# Patient Record
Sex: Male | Born: 1955 | Hispanic: No | Marital: Married | Smoking: Never smoker
Health system: Southern US, Community
[De-identification: ages and names within clinical notes are randomized; demographics above are authoritative.]

## PROBLEM LIST (undated history)

## (undated) DIAGNOSIS — E119 Type 2 diabetes mellitus without complications: Secondary | ICD-10-CM

## (undated) DIAGNOSIS — I10 Essential (primary) hypertension: Secondary | ICD-10-CM

## (undated) DIAGNOSIS — E7800 Pure hypercholesterolemia, unspecified: Secondary | ICD-10-CM

## (undated) HISTORY — PX: OTHER SURGICAL HISTORY: SHX169

---

## 2009-08-17 ENCOUNTER — Emergency Department (HOSPITAL_COMMUNITY): Admission: EM | Admit: 2009-08-17 | Discharge: 2009-08-17 | Payer: Self-pay | Admitting: Emergency Medicine

## 2010-03-11 ENCOUNTER — Ambulatory Visit (HOSPITAL_BASED_OUTPATIENT_CLINIC_OR_DEPARTMENT_OTHER): Admission: RE | Admit: 2010-03-11 | Discharge: 2010-03-11 | Payer: Self-pay | Admitting: General Surgery

## 2010-08-26 LAB — GLUCOSE, CAPILLARY
Glucose-Capillary: 136 mg/dL — ABNORMAL HIGH (ref 70–99)
Glucose-Capillary: 163 mg/dL — ABNORMAL HIGH (ref 70–99)

## 2010-08-26 LAB — BASIC METABOLIC PANEL
Calcium: 8.9 mg/dL (ref 8.4–10.5)
Chloride: 104 mEq/L (ref 96–112)
GFR calc Af Amer: >60 mL/min (ref >60)
GFR calc non Af Amer: >60 mL/min (ref >60)

## 2010-08-26 LAB — POCT HEMOGLOBIN-HEMACUE: Hemoglobin: 14.3 g/dL (ref 13.0–17.0)

## 2015-09-03 ENCOUNTER — Encounter (HOSPITAL_COMMUNITY): Payer: Self-pay

## 2015-09-03 ENCOUNTER — Emergency Department (HOSPITAL_COMMUNITY): Payer: Medicaid Other

## 2015-09-03 ENCOUNTER — Observation Stay (HOSPITAL_COMMUNITY)
Admission: EM | Admit: 2015-09-03 | Discharge: 2015-09-04 | Disposition: A | Discharge status: Home / Self Care | Payer: Medicaid Other | Attending: Internal Medicine | Admitting: Internal Medicine

## 2015-09-03 DIAGNOSIS — I10 Essential (primary) hypertension: Secondary | ICD-10-CM | POA: Diagnosis present

## 2015-09-03 DIAGNOSIS — N183 Chronic kidney disease, stage 3 unspecified: Secondary | ICD-10-CM | POA: Insufficient documentation

## 2015-09-03 DIAGNOSIS — R29818 Other symptoms and signs involving the nervous system: Secondary | ICD-10-CM | POA: Diagnosis present

## 2015-09-03 DIAGNOSIS — R200 Anesthesia of skin: Secondary | ICD-10-CM | POA: Diagnosis not present

## 2015-09-03 DIAGNOSIS — I5032 Chronic diastolic (congestive) heart failure: Secondary | ICD-10-CM | POA: Diagnosis not present

## 2015-09-03 DIAGNOSIS — I6523 Occlusion and stenosis of bilateral carotid arteries: Secondary | ICD-10-CM | POA: Insufficient documentation

## 2015-09-03 DIAGNOSIS — N184 Chronic kidney disease, stage 4 (severe): Secondary | ICD-10-CM | POA: Insufficient documentation

## 2015-09-03 DIAGNOSIS — R2990 Unspecified symptoms and signs involving the nervous system: Secondary | ICD-10-CM | POA: Diagnosis not present

## 2015-09-03 DIAGNOSIS — E7800 Pure hypercholesterolemia, unspecified: Secondary | ICD-10-CM | POA: Diagnosis present

## 2015-09-03 DIAGNOSIS — R202 Paresthesia of skin: Secondary | ICD-10-CM | POA: Diagnosis not present

## 2015-09-03 DIAGNOSIS — E1121 Type 2 diabetes mellitus with diabetic nephropathy: Secondary | ICD-10-CM | POA: Insufficient documentation

## 2015-09-03 DIAGNOSIS — G459 Transient cerebral ischemic attack, unspecified: Secondary | ICD-10-CM | POA: Diagnosis present

## 2015-09-03 DIAGNOSIS — I130 Hypertensive heart and chronic kidney disease with heart failure and stage 1 through stage 4 chronic kidney disease, or unspecified chronic kidney disease: Secondary | ICD-10-CM | POA: Insufficient documentation

## 2015-09-03 DIAGNOSIS — E119 Type 2 diabetes mellitus without complications: Secondary | ICD-10-CM | POA: Insufficient documentation

## 2015-09-03 DIAGNOSIS — Z7982 Long term (current) use of aspirin: Secondary | ICD-10-CM | POA: Diagnosis not present

## 2015-09-03 DIAGNOSIS — E1122 Type 2 diabetes mellitus with diabetic chronic kidney disease: Secondary | ICD-10-CM | POA: Insufficient documentation

## 2015-09-03 DIAGNOSIS — N182 Chronic kidney disease, stage 2 (mild): Secondary | ICD-10-CM | POA: Insufficient documentation

## 2015-09-03 HISTORY — DX: Essential (primary) hypertension: I10

## 2015-09-03 HISTORY — DX: Pure hypercholesterolemia, unspecified: E78.00

## 2015-09-03 HISTORY — DX: Type 2 diabetes mellitus without complications: E11.9

## 2015-09-03 LAB — COMPREHENSIVE METABOLIC PANEL
ALBUMIN: 3.8 g/dL (ref 3.5–5.0)
ALK PHOS: 88 U/L (ref 38–126)
ALT: 28 U/L (ref 17–63)
ANION GAP: 11 (ref 5–15)
AST: 22 U/L (ref 15–41)
BILIRUBIN TOTAL: 0.5 mg/dL (ref 0.3–1.2)
BUN: 29 mg/dL — ABNORMAL HIGH (ref 6–20)
CALCIUM: 9.2 mg/dL (ref 8.9–10.3)
CO2: 21 mmol/L — ABNORMAL LOW (ref 22–32)
Chloride: 107 mmol/L (ref 101–111)
Creatinine, Ser: 1.67 mg/dL — ABNORMAL HIGH (ref 0.61–1.24)
GFR calc non Af Amer: 43 mL/min — ABNORMAL LOW (ref >60)
GFR, EST AFRICAN AMERICAN: 50 mL/min — AB (ref >60)
GLUCOSE: 113 mg/dL — AB (ref 65–99)
Potassium: 4.4 mmol/L (ref 3.5–5.1)
Sodium: 139 mmol/L (ref 135–145)
TOTAL PROTEIN: 6.8 g/dL (ref 6.5–8.1)

## 2015-09-03 LAB — CBC
HEMATOCRIT: 43.7 % (ref 39.0–52.0)
HEMOGLOBIN: 14.5 g/dL (ref 13.0–17.0)
MCH: 25.7 pg — AB (ref 26.0–34.0)
MCHC: 33.2 g/dL (ref 30.0–36.0)
MCV: 77.5 fL — AB (ref 78.0–100.0)
Platelets: 201 10*3/uL (ref 150–400)
RBC: 5.64 MIL/uL (ref 4.22–5.81)
RDW: 15.4 % (ref 11.5–15.5)
WBC: 7.6 10*3/uL (ref 4.0–10.5)

## 2015-09-03 LAB — GLUCOSE, CAPILLARY: GLUCOSE-CAPILLARY: 84 mg/dL (ref 65–99)

## 2015-09-03 LAB — PROTIME-INR
INR: 1.14 (ref 0.00–1.49)
Prothrombin Time: 14.4 seconds (ref 11.6–15.2)

## 2015-09-03 LAB — I-STAT TROPONIN, ED: Troponin i, poc: 0 ng/mL (ref 0.00–0.08)

## 2015-09-03 LAB — I-STAT CHEM 8, ED
BUN: 31 mg/dL — ABNORMAL HIGH (ref 6–20)
CALCIUM ION: 1.19 mmol/L (ref 1.13–1.30)
CREATININE: 1.6 mg/dL — AB (ref 0.61–1.24)
Chloride: 106 mmol/L (ref 101–111)
GLUCOSE: 109 mg/dL — AB (ref 65–99)
HCT: 47 % (ref 39.0–52.0)
HEMOGLOBIN: 16 g/dL (ref 13.0–17.0)
Potassium: 4.4 mmol/L (ref 3.5–5.1)
Sodium: 141 mmol/L (ref 135–145)
TCO2: 21 mmol/L (ref 0–100)

## 2015-09-03 LAB — ETHANOL: Alcohol, Ethyl (B): <5 mg/dL (ref <5)

## 2015-09-03 LAB — APTT: aPTT: 30 seconds (ref 24–37)

## 2015-09-03 MED ORDER — OMEGA-3-ACID ETHYL ESTERS 1 G PO CAPS
1.0000 g | ORAL_CAPSULE | Freq: Two times a day (BID) | ORAL | Status: DC
  Administered 2015-09-04: 1 g via ORAL
  Filled 2015-09-03 (×3): qty 1

## 2015-09-03 MED ORDER — IRBESARTAN 75 MG PO TABS
75.0000 mg | ORAL_TABLET | Freq: Every day | ORAL | Status: DC
  Administered 2015-09-04: 75 mg via ORAL
  Filled 2015-09-03: qty 1

## 2015-09-03 MED ORDER — ATENOLOL 50 MG PO TABS
50.0000 mg | ORAL_TABLET | Freq: Every day | ORAL | Status: DC
  Administered 2015-09-04: 50 mg via ORAL
  Filled 2015-09-03: qty 1

## 2015-09-03 MED ORDER — ASPIRIN 81 MG PO CHEW
81.0000 mg | CHEWABLE_TABLET | Freq: Once | ORAL | Status: AC
  Administered 2015-09-03: 81 mg via ORAL
  Filled 2015-09-03: qty 1

## 2015-09-03 MED ORDER — GLIPIZIDE 10 MG PO TABS
20.0000 mg | ORAL_TABLET | Freq: Every day | ORAL | Status: DC
  Administered 2015-09-04: 20 mg via ORAL
  Filled 2015-09-03: qty 2

## 2015-09-03 MED ORDER — ASPIRIN 300 MG RE SUPP
300.0000 mg | Freq: Every day | RECTAL | Status: DC
  Administered 2015-09-03: 300 mg via RECTAL
  Filled 2015-09-03 (×2): qty 1

## 2015-09-03 MED ORDER — SENNOSIDES-DOCUSATE SODIUM 8.6-50 MG PO TABS: 1.0000 | ORAL_TABLET | Freq: Every evening | ORAL | Status: DC | PRN

## 2015-09-03 MED ORDER — ASPIRIN 325 MG PO TABS
325.0000 mg | ORAL_TABLET | Freq: Every day | ORAL | Status: DC
  Administered 2015-09-04: 325 mg via ORAL
  Filled 2015-09-03 (×2): qty 1

## 2015-09-03 MED ORDER — ZOLPIDEM TARTRATE 5 MG PO TABS: 5.0000 mg | ORAL_TABLET | Freq: Every evening | ORAL | Status: DC | PRN

## 2015-09-03 MED ORDER — PANTOPRAZOLE SODIUM 40 MG PO TBEC
40.0000 mg | DELAYED_RELEASE_TABLET | Freq: Every day | ORAL | Status: DC
  Administered 2015-09-04: 40 mg via ORAL
  Filled 2015-09-03: qty 1

## 2015-09-03 MED ORDER — METFORMIN HCL 500 MG PO TABS: 1000.0000 mg | ORAL_TABLET | Freq: Every day | ORAL | Status: DC

## 2015-09-03 MED ORDER — STROKE: EARLY STAGES OF RECOVERY BOOK
Freq: Once | Status: DC
  Filled 2015-09-03: qty 1

## 2015-09-03 MED ORDER — INSULIN DETEMIR 100 UNIT/ML FLEXPEN: 10.0000 [IU] | PEN_INJECTOR | Freq: Every day | SUBCUTANEOUS | Status: DC

## 2015-09-03 NOTE — ED Notes (Signed)
Per pt, around 7 am this morning.  Noted numbness to rt hand and rt face. No change in speech.  Notified MD and went to Dr. Jonelle Sidle.  MD felt pt had stroke and sent him here.  Numbness is still there.  No neuro deficit noted.  Will notify MD.

## 2015-09-03 NOTE — Progress Notes (Signed)
PHARMACIST - PHYSICIAN COMMUNICATION DR: Rama  CONCERNING:  METFORMIN SAFE ADMINISTRATION POLICY  RECOMMENDATION: Metformin has been placed on DISCONTINUE (rejected order) STATUS and should be reordered only after any of the conditions below are ruled out.  Current Safety recommendations include avoiding metformin for a minimum of 48 hours after the patient's exposure to intravenous contrast media for the following conditions:  . eGFR < 60 ml/min  . Liver disease, alcoholism, heart failure, intra-arterial administration of contrast  DESCRIPTION:  The Pharmacy Committee has adopted a policy that restricts the use of metformin in hospitalized patients until all the contraindications to administration have been ruled out. Specific contraindications are: _0  Serum creatinine ? 1.5 for males _1  Serum creatinine ? 1.4 for females _2  Shock, acute MI, sepsis, hypoxemia, dehydration _3  Planned administration of intravenous iodinated contrast media when eGFR < 7m/min, Liver Disease, alcoholism, heart failure or intra-arterial administration of contrast _4  Heart Failure patients with low EF _5  Acute or chronic metabolic acidosis (including DKA)  TRomeo Rabon PharmD, pager [phone] 09/03/2015,6:07 PM.

## 2015-09-03 NOTE — ED Notes (Signed)
PT CAN GO UP AT 16:31

## 2015-09-03 NOTE — ED Notes (Signed)
Bed: [id number] Expected date:  Expected time:  Means of arrival:  Comments: Waiting to go up

## 2015-09-03 NOTE — H&P (Signed)
History and PhysicalRangel Fowler   [id number] DOB: [birth year] DOA: 09/03/2015  Referring MD/provider: Dr. Thomasene Lot PCP: Barbette Merino, MD   Chief Complaint: Transient facial numbness.  History of Present Illness:   Gabriel Fowler is an 60 y.o. male with a PMH of hypertension, diabetes and hyperlipidemia who comes to the hospital with reports of transient facial numbness in the perioral area and right arm which lasted approximately 1 minute. He had a similar episode yesterday, and an episode approximately 1 month ago. The patient denies any associated headache, speech difficulty, focal neurologic deficits or clumsiness, diplopia, or other symptoms suggestive of stroke. He states that he takes an aspirin daily. Patient reports that he recently started on an exercise regimen and wonders if this has anything to do with his symptoms. There were no other potential aggravating factors and the symptoms were transient. Upon initial evaluation in the ED, a CT scan of his head showed minimal periventricular small vessel disease but no acute infarct.  ROS:   Review of Systems  Constitutional: Negative.   HENT: Negative.   Eyes: Negative for blurred vision and double vision.  Respiratory: Negative for shortness of breath.   Cardiovascular: Negative for chest pain.  Gastrointestinal: Negative.   Genitourinary: Negative.   Musculoskeletal: Negative.   Skin: Negative.   Neurological: Positive for tingling and sensory change. Negative for dizziness, tremors, speech change, focal weakness, seizures, loss of consciousness and headaches.  Endo/Heme/Allergies: Negative.   Psychiatric/Behavioral: Negative.      Past Medical History:   Past Medical History  Diagnosis Date  . Diabetes mellitus without complication ([place])   . Hypertension   . Hypercholesteremia     Past Surgical History:   Past Surgical History  Procedure Laterality Date  . Extra skin removed from back of neck       Social History:   Social History   Social History  . Marital Status: Married    Spouse Name: N/A  . Number of Children: 3  . Years of Education: N/A   Occupational History  . Disabled    Social History Main Topics  . Smoking status: Never Smoker   . Smokeless tobacco: Never Used  . Alcohol Use: No  . Drug Use: No  . Sexual Activity: Yes   Other Topics Concern  . Not on file   Social History Narrative   Married.  Lives with wife.    Family history:   Family History  Problem Relation Age of Onset  . Stroke Neg Hx   . Heart disease Neg Hx   . Cancer Neg Hx     Allergies   Review of patient's allergies indicates no known allergies.  Current Medications:   Prior to Admission medications   Medication Sig Start Date End Date Taking? Authorizing Provider  aspirin 81 MG tablet Take 81 mg by mouth daily.   Yes Historical Provider, MD  atenolol (TENORMIN) 50 MG tablet Take 1 tablet by mouth daily. 08/25/15  Yes Historical Provider, MD  glipiZIDE (GLUCOTROL) 10 MG tablet Take 2 tablets by mouth daily.  08/25/15  Yes Historical Provider, MD  metFORMIN (GLUCOPHAGE) 1000 MG tablet Take 1 tablet by mouth daily.  08/25/15  Yes Historical Provider, MD  NEXIUM 40 MG capsule Take 1 capsule by mouth daily. 08/25/15  Yes Historical Provider, MD  Omega 3-6-9 Fatty Acids (OMEGA 3-6-9 COMPLEX PO) Take 1 tablet by mouth daily.   Yes Historical Provider, MD  valsartan (DIOVAN) 80 MG tablet  Take 1 tablet by mouth daily. 08/31/15  Yes Historical Provider, MD  zolpidem (AMBIEN) 5 MG tablet Take 1 tablet by mouth at bedtime as needed for sleep. Reported on 09/03/2015 06/16/15  Yes Historical Provider, MD  LEVEMIR FLEXTOUCH 100 UNIT/ML Pen Inject 10 Units into the skin at bedtime. 08/25/15   Historical Provider, MD    Physical Exam:   Filed Vitals:   09/03/15 1330 09/03/15 1342 09/03/15 1345 09/03/15 1422  BP: 146/88  158/89 146/94  Pulse: 56  57 58  Temp:  97.9 F (36.6 C)    TempSrc:       Resp:    24  SpO2: 99%  97% 99%     Physical Exam: Blood pressure 146/94, pulse 58, temperature 97.9 F (36.6 C), temperature source Oral, resp. rate 24, SpO2 99 %. Gen: No acute distress. Head: Normocephalic, atraumatic. Eyes: PERRL, EOMI, sclerae nonicteric. Mouth: Oropharynx clear, tongue midline, moist mucous membranes. Neck: Supple, no thyromegaly, no lymphadenopathy, no jugular venous distention. Chest: Lungs are clear to auscultation bilaterally. CV: Heart sounds are regular with no murmurs, rubs, or gallops. Abdomen: Soft, nontender, nondistended with normal active bowel sounds. Extremities: Extremities are without clubbing, edema, or cyanosis. Skin: Warm and dry. Neuro: Alert and oriented times 3; cranial nerves II-12 grossly intact, moves all extremities 4 with equal strength. Psych: Mood and affect normal.   Data Review:    Labs: Basic Metabolic Panel:  Recent Labs Lab 09/03/15 1334 09/03/15 1343  NA 139 141  K 4.4 4.4  CL 107 106  CO2 21*  --   GLUCOSE 113* 109*  BUN 29* 31*  CREATININE 1.67* 1.60*  CALCIUM 9.2  --    Liver Function Tests:  Recent Labs Lab 09/03/15 1334  AST 22  ALT 28  ALKPHOS 88  BILITOT 0.5  PROT 6.8  ALBUMIN 3.8   CBC:  Recent Labs Lab 09/03/15 1334 09/03/15 1343  WBC 7.6  --   HGB 14.5 16.0  HCT 43.7 47.0  MCV 77.5*  --   PLT 201  --     Radiographic Studies: Ct Head Wo Contrast  09/03/2015  CLINICAL DATA:  Right-sided facial numbness for 1 day EXAM: CT HEAD WITHOUT CONTRAST TECHNIQUE: Contiguous axial images were obtained from the base of the skull through the vertex without intravenous contrast. COMPARISON:  None. FINDINGS: The ventricles are normal in size and configuration. There is no intracranial mass, hemorrhage, extra-axial fluid collection, or midline shift. Is minimal small vessel disease in the centra semiovale bilaterally. Elsewhere, gray-white compartments are normal. No acute infarct evident. The  bony calvarium appears intact. The mastoid air cells are clear. Orbits appear symmetric bilaterally. There is opacification in multiple ethmoid air cells bilaterally. There is also inferior frontal sinus mucosal thickening bilaterally. IMPRESSION: Areas of paranasal sinus disease. Minimal periventricular small vessel disease. No intracranial mass, hemorrhage, or evidence of acute infarct. Electronically Signed   By: Lowella Grip III M.D.   On: 09/03/2015 14:57   *I have personally reviewed the images above*  EKG: Independently reviewed. Normal sinus rhythm, no ischemic changes.   Assessment/Plan:   Principal Problem: Transient Ischemic Attack/stroke like symptoms   TIA (transient ischemic attack) - Admit to observation status on a telemetry bed. - Obtain PT, OT and speech therapy consultations. - Check hemoglobin A1c and lipid profile. - Check MRI/MRA of the brain, 2-D echo, and carotid Dopplers. - Neuro checks ordered every 2 hours. - Aspirin ordered.  Active Problems:   Diabetes mellitus  without complication (HCC) - Check hemoglobin A1c and continue home medications including Levemir, Glucotrol, and metformin. - Monitor glycemic control with CBG checks before each meal and at bedtime.    Hypertension - Continue atenolol and Diovan.    Hypercholesteremia - Check fasting lipid panel. Continue fish oil supplement.    DVT prophylaxis - Low risk, SCDs ordered.  Code Status / Family Communication / Disposition Plan:   Code Status: Full. Family Communication: Wife and daughter at the bedside. Mauri Reading (daughter) [phone]. Disposition Plan: Home tomorrow after work up completed.   Time spent: 1 hour.  RAMA,CHRISTINA [facility] Pager [phone] Cell: [phone]   If 7PM-7AM, please contact night-coverage [email or web address] Password [facility] 09/03/2015, 4:40 PM

## 2015-09-03 NOTE — ED Notes (Signed)
Patient transported to CT 

## 2015-09-03 NOTE — ED Provider Notes (Signed)
CSN: [id number]     Arrival date & time 09/03/15  1235 History   First MD Initiated Contact with Patient 09/03/15 1250     Chief Complaint  Patient presents with  . Numbness     (Consider location/radiation/quality/duration/timing/severity/associated sxs/prior Treatment) HPI   Patient is a 60 year old male with history of hypertension, diabetes, hyperlipidemia. He is presenting with now resolved numbness in his right face. Started at 7 AM this morning he went to primary care physician who sent him here.  Patient had no difficulty speaking or weakness of the time. He had numbness and tingling in his right face and right arm.    Past Medical History  Diagnosis Date  . Diabetes mellitus without complication ([place])   . Hypertension   . Hypercholesteremia    No past surgical history on file. No family history on file. Social History  Substance Use Topics  . Smoking status: Never Smoker   . Smokeless tobacco: Not on file  . Alcohol Use: No    Review of Systems  Constitutional: Negative for activity change.  Respiratory: Negative for cough and shortness of breath.   Cardiovascular: Negative for chest pain.  Gastrointestinal: Negative for abdominal pain.  Genitourinary: Negative for dysuria and urgency.  Musculoskeletal: Negative for arthralgias.  Allergic/Immunologic: Negative for immunocompromised state.  Neurological: Positive for numbness. Negative for speech difficulty and weakness.  All other systems reviewed and are negative.     Allergies  Review of patient's allergies indicates no known allergies.  Home Medications   Prior to Admission medications   Not on File   BP 158/89 mmHg  Pulse 57  Temp(Src) 97.9 F (36.6 C) (Oral)  Resp 18  SpO2 97% Physical Exam  Constitutional: He is oriented to person, place, and time. He appears well-nourished.  HENT:  Head: Normocephalic.  Mouth/Throat: Oropharynx is clear and moist.  Eyes: Conjunctivae are normal.   Neck: No tracheal deviation present.  Cardiovascular: Normal rate.   Pulmonary/Chest: Effort normal. No stridor. No respiratory distress.  Abdominal: Soft. There is no tenderness. There is no guarding.  Musculoskeletal: Normal range of motion. He exhibits no edema.  Neurological: He is oriented to person, place, and time. No cranial nerve deficit.  Cranial nerves II-12 intact no difference in sensation and right left sides of face. No weakness in right arm.  Skin: Skin is warm and dry. No rash noted. He is not diaphoretic.  Psychiatric: He has a normal mood and affect. His behavior is normal.  Nursing note and vitals reviewed.   ED Course  Procedures (including critical care time) Labs Review Labs Reviewed  I-STAT CHEM 8, ED - Abnormal; Notable for the following:    BUN 31 (*)    Creatinine, Ser 1.60 (*)    Glucose, Bld 109 (*)    All other components within normal limits  ETHANOL  PROTIME-INR  APTT  CBC  DIFFERENTIAL  COMPREHENSIVE METABOLIC PANEL  URINE RAPID DRUG SCREEN, HOSP PERFORMED  URINALYSIS, ROUTINE W REFLEX MICROSCOPIC (NOT AT [facility])  I-STAT TROPOININ, ED    Imaging Review No results found. I have personally reviewed and evaluated these images and lab results as part of my medical decision-making.   EKG Interpretation   Date/Time:  Thursday September 03 2015 13:47:26 EDT Ventricular Rate:  55 PR Interval:  166 QRS Duration: 79 QT Interval:  414 QTC Calculation: 396 R Axis:   33 Text Interpretation:  Sinus rhythm no acute ischemia No significant change  since last tracing Confirmed by  Gerald Leitz ([id number]) on 09/03/2015  1:54:35 PM      MDM   Final diagnoses:  None    Patient is a 60 year old Arabic speaking gentleman presenting with numbness to his right face. She went to primary care physician today with symptoms starting at 7 AM. Was sent here for evaluation of stroke. On my interview with patient he initially said that he had numbness however  when evaluating on physical exam there is no difference between the right and left side of his face. No pronator drift. No weakness. Cranial nerves intact. We will call neurology, work him up for TIA.   1:55 PM Will admit for neurology work up.   Abeer Iversen Julio Alm, MD 09/03/15 1609

## 2015-09-04 ENCOUNTER — Observation Stay (HOSPITAL_COMMUNITY): Payer: Medicaid Other

## 2015-09-04 ENCOUNTER — Observation Stay (HOSPITAL_BASED_OUTPATIENT_CLINIC_OR_DEPARTMENT_OTHER): Payer: Medicaid Other

## 2015-09-04 DIAGNOSIS — E7800 Pure hypercholesterolemia, unspecified: Secondary | ICD-10-CM

## 2015-09-04 DIAGNOSIS — R29818 Other symptoms and signs involving the nervous system: Secondary | ICD-10-CM

## 2015-09-04 DIAGNOSIS — E1129 Type 2 diabetes mellitus with other diabetic kidney complication: Secondary | ICD-10-CM

## 2015-09-04 DIAGNOSIS — R2990 Unspecified symptoms and signs involving the nervous system: Secondary | ICD-10-CM

## 2015-09-04 DIAGNOSIS — G459 Transient cerebral ischemic attack, unspecified: Secondary | ICD-10-CM

## 2015-09-04 DIAGNOSIS — N058 Unspecified nephritic syndrome with other morphologic changes: Secondary | ICD-10-CM

## 2015-09-04 DIAGNOSIS — I6523 Occlusion and stenosis of bilateral carotid arteries: Secondary | ICD-10-CM

## 2015-09-04 DIAGNOSIS — N183 Chronic kidney disease, stage 3 unspecified: Secondary | ICD-10-CM | POA: Insufficient documentation

## 2015-09-04 DIAGNOSIS — I10 Essential (primary) hypertension: Secondary | ICD-10-CM

## 2015-09-04 DIAGNOSIS — I5032 Chronic diastolic (congestive) heart failure: Secondary | ICD-10-CM

## 2015-09-04 DIAGNOSIS — E119 Type 2 diabetes mellitus without complications: Secondary | ICD-10-CM | POA: Insufficient documentation

## 2015-09-04 DIAGNOSIS — E1121 Type 2 diabetes mellitus with diabetic nephropathy: Secondary | ICD-10-CM | POA: Insufficient documentation

## 2015-09-04 DIAGNOSIS — N184 Chronic kidney disease, stage 4 (severe): Secondary | ICD-10-CM | POA: Insufficient documentation

## 2015-09-04 LAB — GLUCOSE, CAPILLARY
GLUCOSE-CAPILLARY: 109 mg/dL — AB (ref 65–99)
GLUCOSE-CAPILLARY: 97 mg/dL (ref 65–99)
Glucose-Capillary: 120 mg/dL — ABNORMAL HIGH (ref 65–99)

## 2015-09-04 LAB — LIPID PANEL
CHOLESTEROL: 246 mg/dL — AB (ref 0–200)
HDL: 41 mg/dL (ref >40)
LDL Cholesterol: 169 mg/dL — ABNORMAL HIGH (ref 0–99)
Total CHOL/HDL Ratio: 6 RATIO
Triglycerides: 181 mg/dL — ABNORMAL HIGH (ref <150)
VLDL: 36 mg/dL (ref 0–40)

## 2015-09-04 LAB — ECHOCARDIOGRAM COMPLETE
Height: 72 in
Weight: 3629.65 oz

## 2015-09-04 MED ORDER — CLOPIDOGREL BISULFATE 75 MG PO TABS: 75.0000 mg | ORAL_TABLET | Freq: Every day | ORAL | Status: DC

## 2015-09-04 MED ORDER — PRAVASTATIN SODIUM 40 MG PO TABS: 40.0000 mg | ORAL_TABLET | Freq: Every day | ORAL | Status: DC

## 2015-09-04 NOTE — Care Management Note (Signed)
Case Management Note  Patient Details  Name: Quinlan Edley MRN: [id number] Date of Birth: June 12, 1956  Subjective/Objective: 60 y/o m admitted w/TIA. Speaks Arabic. From home.PT/OT-no needs.                   Action/Plan:d/c plan home.   Expected Discharge Date:   (UNKNOWN)               Expected Discharge Plan:  Home/Self Care  In-House Referral:  Interpreting Services  Discharge planning Services  CM Consult  Post Acute Care Choice:    Choice offered to:     DME Arranged:    DME Agency:     HH Arranged:    HH Agency:     Status of Service:  In process, will continue to follow  Medicare Important Message Given:    Date Medicare IM Given:    Medicare IM give by:    Date Additional Medicare IM Given:    Additional Medicare Important Message give by:     If discussed at [place] of Stay Meetings, dates discussed:    Additional Comments:  Dessa Phi, RN 09/04/2015, 3:50 PM

## 2015-09-04 NOTE — Progress Notes (Signed)
OT Cancellation Note  Patient Details Name: Gabriel Fowler MRN: [id number] DOB: July 03, 1955   Cancelled Treatment:    Reason Eval/Treat Not Completed: OT screened, no needs identified, will sign off  Sherle Mello A 09/04/2015, 11:07 AM

## 2015-09-04 NOTE — Progress Notes (Signed)
VASCULAR LAB PRELIMINARY  PRELIMINARY  PRELIMINARY  PRELIMINARY  Carotid duplex  completed.    Preliminary report:  Bilateral:  1-39% ICA stenosis.  Vertebral artery flow is antegrade.      Steadman Prosperi, RVT 09/04/2015, 10:27 AM

## 2015-09-04 NOTE — Evaluation (Signed)
Physical Therapy Evaluation Patient Details Name: Gabriel Fowler MRN: [id number] DOB: [birth year] Today's Date: 09/04/2015   History of Present Illness  60 yo male admitted with TIA  Clinical Impression  On eval, pt was Mod Ind-Ind with all tasks. No LOB. 1xeval. Sign off.    Follow Up Recommendations No PT follow up    Equipment Recommendations  None recommended by PT    Recommendations for Other Services       Precautions / Restrictions Precautions Precautions: None Restrictions Weight Bearing Restrictions: No      Mobility  Bed Mobility Overal bed mobility: Independent                Transfers Overall transfer level: Independent                  Ambulation/Gait Ambulation/Gait assistance: Independent Ambulation Distance (Feet): 160 Feet Assistive device: None Gait Pattern/deviations: WFL(Within Functional Limits)        Stairs Stairs: Yes Stairs assistance: Modified independent (Device/Increase time) Stair Management: Alternating pattern;Forwards Number of Stairs: 9    Wheelchair Mobility    Modified Rankin (Stroke Patients Only)       Balance Overall balance assessment: No apparent balance deficits (not formally assessed)                                           Pertinent Vitals/Pain Pain Assessment: No/denies pain    Home Living Family/patient expects to be discharged to:: Private residence                      Prior Function Level of Independence: Independent               Hand Dominance        Extremity/Trunk Assessment   Upper Extremity Assessment: Overall WFL for tasks assessed           Lower Extremity Assessment: Overall WFL for tasks assessed      Cervical / Trunk Assessment: Normal  Communication   Communication: No difficulties;Prefers language other than English  Cognition Arousal/Alertness: Awake/alert Behavior During Therapy: WFL for tasks  assessed/performed Overall Cognitive Status: Difficult to assess (appears [facility])                      General Comments      Exercises        Assessment/Plan    PT Assessment Patent does not need any further PT services  PT Diagnosis     PT Problem List    PT Treatment Interventions     PT Goals (Current goals can be found in the Care Plan section) Acute Rehab PT Goals Patient Stated Goal: none stated PT Goal Formulation: All assessment and education complete, DC therapy    Frequency     Barriers to discharge        Co-evaluation               End of Session Equipment Utilized During Treatment: Gait belt Activity Tolerance: Patient tolerated treatment well Patient left: in chair      Functional Assessment Tool Used: clinical judgement Functional Limitation: Mobility: Walking and moving around Mobility: Walking and Moving Around Current Status [id number]): 0 percent impaired, limited or restricted Mobility: Walking and Moving Around Goal Status [id number]): 0 percent impaired, limited or restricted Mobility: Walking and Moving Around Discharge Status [phone]):  0 percent impaired, limited or restricted    Time: 1052-1101 PT Time Calculation (min) (ACUTE ONLY): 9 min   Charges:   PT Evaluation $PT Eval Low Complexity: 1 Procedure     PT G Codes:   PT G-Codes **NOT FOR INPATIENT CLASS** Functional Assessment Tool Used: clinical judgement Functional Limitation: Mobility: Walking and moving around Mobility: Walking and Moving Around Current Status [id number]): 0 percent impaired, limited or restricted Mobility: Walking and Moving Around Goal Status [id number]): 0 percent impaired, limited or restricted Mobility: Walking and Moving Around Discharge Status [phone]): 0 percent impaired, limited or restricted    Weston Anna, MPT Pager: [phone]

## 2015-09-04 NOTE — Discharge Summary (Signed)
Physician Discharge Summary  Gabriel Fowler [id number] DOB: [birth year] DOA: 09/03/2015  PCP: Barbette Merino, MD  Admit date: 09/03/2015 Discharge date: 09/04/2015  Time spent: 35 minutes  Recommendations for Outpatient Follow-up:  Repeat lipid panel and LFT's in 4-6 weeks; adjust statins regimen further as needed  Repeat BMET to follow electrolytes and renal function Follow closely diabetes and consider exchanging metformin for long term therapy as his renal function level is above 1.5   Discharge Diagnoses:  Principal Problem:   TIA (transient ischemic attack) Active Problems:   Stroke-like symptom   Diabetes mellitus without complication (HCC)   Hypertension   Hypercholesteremia Chronic diastolic heart failure 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 Discharge home with instructions to follow with PCP in 10 days.  Diet recommendation: heart healthy and modified carb diet   Filed Weights   09/03/15 1744  Weight: 102.9 kg (226 lb 13.7 oz)    History of present illness:  As per H&P written by Dr. Rockne Menghini on 09/04/15 60 y.o. male with a PMH of hypertension, diabetes and hyperlipidemia who comes to the hospital with reports of transient facial numbness in the perioral area and right arm which lasted approximately 1 minute. He had a similar episode yesterday, and an episode approximately 1 month ago. The patient denies any associated headache, speech difficulty, focal neurologic deficits or clumsiness, diplopia, or other symptoms suggestive of stroke. He states that he takes an aspirin daily. Patient reports that he recently started on an exercise regimen and wonders if this has anything to do with his symptoms. There were no other potential aggravating factors and the symptoms were transient. Upon initial evaluation in the ED, a CT scan of his head showed minimal periventricular small vessel disease but no acute  infarct.   Hospital Course:  Transient Ischemic Attack/stroke like symptoms  TIA (transient ischemic attack) - no abnormalities seen on telemetry -Head CT scan, MRI/MRA w/o acute stroke -appears to be secondary to small vessels disease  -no deficit or PT/OT needs at discharge -2-D echo and carotid dopplers essentially not revealing cause for symptoms -will substitute ASA for plavix. -started on statins -continue risk factors modifications   Diabetes mellitus with nephropathy ([place]) - Continue home hypoglycemic regimen -close follow up with PCP -CBG's remains well controlled during hospitalization -Cr 1.6; will recommend substitution of metformin for long term therapy, given Cr level    Hypertension -Continue atenolol and Diovan. -advise to follow heart healthy diet   Hypercholesteremia -Continue fish oil supplement -started on pravachol  -LDL 169 -follow lipid panel and LFT's in 4-6 weeks  Chronic diastolic heart failure: -compensated -continue BP control, daily weight checks and low sodium diet  Chronic renal failure stage 2-3 -appears to be secondary to HTN and diabetes -at baseline -advise to follow adequate hydration -continue BP and diabetes management -close follow up with PCP  Bilateral ICA: -1-39% -started on plavix and statins -continue risk factor modifications   Procedures:  2-D echo: - Left ventricle: The cavity size was normal. Systolic function was  normal. The estimated ejection fraction was in the range of 60%  to 65%. Wall motion was normal; there were no regional wall  motion abnormalities. Doppler parameters are consistent with  abnormal left ventricular relaxation (grade 1 diastolic  dysfunction). - Mitral valve: Calcified annulus.  Impressions: - No cardiac source of emboli was indentified.   Carotid duplex: -Bilateral: 1-39% ICA stenosis. Vertebral artery flow is antegrade.  MRI/MRA:  -No acute intracranial  finding. Minimal small vessel change affecting the left thalamus, pons and cerebral hemispheric white matter. -Normal intracranial MR angiography of the large and medium size vessels. -Question small arteriovenous fistula beneath the skullbase on the right, of doubtful significance in the absence of pulsatile Tinnitus.  Consultations:  None   Discharge Exam: Filed Vitals:   09/04/15 1018 09/04/15 1405  BP: 138/71 151/82  Pulse: 56 54  Temp: 97.9 F (36.6 C) 98.2 F (36.8 C)  Resp: 20 18    General: afebrile, no CP, no SOB, no dysarthria, no focal deficit and no distress  Cardiovascular: S1 and S2, no rubs or gallops Respiratory: CTA bilaterally Neuro: AAOX3, no focal neurologic deficit appreciated   Discharge Instructions   Discharge Instructions    Diet - low sodium heart healthy    Complete by:  As directed      Discharge instructions    Complete by:  As directed   Follow heart healthy and low fat diet Start taking Plavix and stop aspirin  Arrange follow up with PCP in 10 days Keep yourself well hydrated          Current Discharge Medication List    START taking these medications   Details  clopidogrel (PLAVIX) 75 MG tablet Take 1 tablet (75 mg total) by mouth daily. Qty: 30 tablet, Refills: 1    pravastatin (PRAVACHOL) 40 MG tablet Take 1 tablet (40 mg total) by mouth daily. Qty: 30 tablet, Refills: 1      CONTINUE these medications which have NOT CHANGED   Details  atenolol (TENORMIN) 50 MG tablet Take 1 tablet by mouth daily. Refills: 1    glipiZIDE (GLUCOTROL) 10 MG tablet Take 2 tablets by mouth daily.  Refills: 2    metFORMIN (GLUCOPHAGE) 1000 MG tablet Take 1 tablet by mouth daily.  Refills: 2    NEXIUM 40 MG capsule Take 1 capsule by mouth daily. Refills: 2    Omega 3-6-9 Fatty Acids (OMEGA 3-6-9 COMPLEX PO) Take 1 tablet by mouth daily.    valsartan (DIOVAN) 80 MG tablet Take 1 tablet by mouth daily. Refills: 0    zolpidem (AMBIEN) 5  MG tablet Take 1 tablet by mouth at bedtime as needed for sleep.  Refills: 1      STOP taking these medications     aspirin 81 MG tablet        No Known Allergies Follow-up Information    Follow up with GARBA,LAWAL, MD. Schedule an appointment as soon as possible for a visit in 1 week.   Specialty:  Internal Medicine   Contact information:   [address] [place] [address] [phone]       The results of significant diagnostics from this hospitalization (including imaging, microbiology, ancillary and laboratory) are listed below for reference.    Significant Diagnostic Studies: Ct Head Wo Contrast  09/03/2015  CLINICAL DATA:  Right-sided facial numbness for 1 day EXAM: CT HEAD WITHOUT CONTRAST TECHNIQUE: Contiguous axial images were obtained from the base of the skull through the vertex without intravenous contrast. COMPARISON:  None. FINDINGS: The ventricles are normal in size and configuration. There is no intracranial mass, hemorrhage, extra-axial fluid collection, or midline shift. Is minimal small vessel disease in the centra semiovale bilaterally. Elsewhere, gray-white compartments are normal. No acute infarct evident. The bony calvarium appears intact. The mastoid air cells are clear. Orbits appear symmetric bilaterally. There is opacification in multiple ethmoid air cells bilaterally. There is  also inferior frontal sinus mucosal thickening bilaterally. IMPRESSION: Areas of paranasal sinus disease. Minimal periventricular small vessel disease. No intracranial mass, hemorrhage, or evidence of acute infarct. Electronically Signed   By: Lowella Grip III M.D.   On: 09/03/2015 14:57   Mr Jodene Nam Head Wo Contrast  09/04/2015  CLINICAL DATA:  Episode of numbness of the right side of the face beginning 10 hours ago, presently resolved. EXAM: MRI HEAD WITHOUT CONTRAST MRA HEAD WITHOUT CONTRAST TECHNIQUE: Multiplanar, multiecho pulse sequences of the brain and surrounding  structures were obtained without intravenous contrast. Angiographic images of the head were obtained using MRA technique without contrast. COMPARISON:  Head CT 09/03/2015 FINDINGS: MRI HEAD FINDINGS Diffusion imaging does not show any acute or subacute infarction. There are minimal small vessel changes of the pons. No focal cerebellar insult. Cerebral hemispheres show a few tiny foci of T2 and FLAIR signal affecting the left thalamus an the cerebral hemispheric white matter consistent with minimal small vessel change. No cortical or large vessel territory infarction. No mass lesion, hemorrhage, hydrocephalus or extra-axial collection. Mild mucosal inflammation affects the paranasal sinuses. MRA HEAD FINDINGS Both internal carotid arteries are widely patent into the brain. The anterior and middle cerebral vessels are patent without proximal stenosis, aneurysm or vascular malformation. Both vertebral arteries are widely patent to the basilar. No basilar stenosis. Posterior circulation branch vessels are normal. Small amount of signal in a vein at the base of the skull on the right could be due to a small arteriovenous fistula. In the absence of pulsatile tinnitus on the right, this is probably not significant. IMPRESSION: No acute intracranial finding. Minimal small vessel change affecting the left thalamus, pons and cerebral hemispheric white matter. Normal intracranial MR angiography of the large and medium size vessels. Question small arteriovenous fistula beneath the skullbase on the right, of doubtful significance in the absence of pulsatile tinnitus. Electronically Signed   By: Nelson Chimes M.D.   On: 09/04/2015 16:43   Mr Brain Wo Contrast  09/04/2015  CLINICAL DATA:  Episode of numbness of the right side of the face beginning 10 hours ago, presently resolved. EXAM: MRI HEAD WITHOUT CONTRAST MRA HEAD WITHOUT CONTRAST TECHNIQUE: Multiplanar, multiecho pulse sequences of the brain and surrounding structures  were obtained without intravenous contrast. Angiographic images of the head were obtained using MRA technique without contrast. COMPARISON:  Head CT 09/03/2015 FINDINGS: MRI HEAD FINDINGS Diffusion imaging does not show any acute or subacute infarction. There are minimal small vessel changes of the pons. No focal cerebellar insult. Cerebral hemispheres show a few tiny foci of T2 and FLAIR signal affecting the left thalamus an the cerebral hemispheric white matter consistent with minimal small vessel change. No cortical or large vessel territory infarction. No mass lesion, hemorrhage, hydrocephalus or extra-axial collection. Mild mucosal inflammation affects the paranasal sinuses. MRA HEAD FINDINGS Both internal carotid arteries are widely patent into the brain. The anterior and middle cerebral vessels are patent without proximal stenosis, aneurysm or vascular malformation. Both vertebral arteries are widely patent to the basilar. No basilar stenosis. Posterior circulation branch vessels are normal. Small amount of signal in a vein at the base of the skull on the right could be due to a small arteriovenous fistula. In the absence of pulsatile tinnitus on the right, this is probably not significant. IMPRESSION: No acute intracranial finding. Minimal small vessel change affecting the left thalamus, pons and cerebral hemispheric white matter. Normal intracranial MR angiography of the large and medium size vessels.  Question small arteriovenous fistula beneath the skullbase on the right, of doubtful significance in the absence of pulsatile tinnitus. Electronically Signed   By: Nelson Chimes M.D.   On: 09/04/2015 16:43   Labs: Basic Metabolic Panel:  Recent Labs Lab 09/03/15 1334 09/03/15 1343  NA 139 141  K 4.4 4.4  CL 107 106  CO2 21*  --   GLUCOSE 113* 109*  BUN 29* 31*  CREATININE 1.67* 1.60*  CALCIUM 9.2  --    Liver Function Tests:  Recent Labs Lab 09/03/15 1334  AST 22  ALT 28  ALKPHOS 88   BILITOT 0.5  PROT 6.8  ALBUMIN 3.8   CBC:  Recent Labs Lab 09/03/15 1334 09/03/15 1343  WBC 7.6  --   HGB 14.5 16.0  HCT 43.7 47.0  MCV 77.5*  --   PLT 201  --    CBG:  Recent Labs Lab 09/03/15 2125 09/04/15 0736 09/04/15 1231 09/04/15 1643  GLUCAP 84 109* 97 120*    Signed:  Barton Dubois MD.  [facility] 09/04/2015, 4:48 PM

## 2015-09-04 NOTE — Evaluation (Signed)
Speech Language Pathology Evaluation Patient Details Name: Gabriel Fowler MRN: [id number] DOB: 01/07/1956 Today's Date: 09/04/2015 Time: [id number] SLP Time Calculation (min) (ACUTE ONLY): 35 min  Problem List:  Patient Active Problem List   Diagnosis Date Noted  . Stroke-like symptom 09/03/2015  . TIA (transient ischemic attack) 09/03/2015  . Diabetes mellitus without complication ([place])   . Hypertension   . Hypercholesteremia    Past Medical History:  Past Medical History  Diagnosis Date  . Diabetes mellitus without complication ([place])   . Hypertension   . Hypercholesteremia    Past Surgical History:  Past Surgical History  Procedure Laterality Date  . Extra skin removed from back of neck     HPI:  60 yo male adm to [facility] with symptoms of right sided labial sensory deficit.  He is from [place] and has been in the [place] for 7 years per his statement.  SLP used interpreter Ipad for evaluation using Arabic interpreter.   CT negative.  MRI pending.   Assessment / Plan / Recommendation Clinical Impression  Pt presents with functional cognitive linguistic abilties based on limited evaluation.  SLP used intepreter video system to faciliate evaluation.  Pt with fluent speech/language expressively and receptively.  He was oriented x4- although did not comprehend work up for potential CVA.  Pt recalled 4/5 words independently after 10 minutes - identifying 5th word from choice of 3 - indicating retreival deficit.  He reports wife manages home duties such as cooking, cleaning and he manages fianances, medication.  Advised him to assure he is taking his medication as indicated = to which he responded he will.  No follow up needed as pt reports he is at baseline level and no overt difficulties identified.      SLP Assessment  Patient does not need any further Speech Lanaguage Pathology Services    Follow Up Recommendations  None    Frequency and Duration           SLP Evaluation Prior  Functioning  Cognitive/Linguistic Baseline: Within functional limits Available Help at Discharge: Family Education: pt reports he is on SSI, pt reports wife cooks and Microbiologist and he manages finances Vocation: On disability   Cognition  Overall Cognitive Status: Within Functional Limits for tasks assessed Arousal/Alertness: Awake/alert Orientation Level: Oriented X4 Attention: Selective Selective Attention: Appears intact Memory: Appears intact (recalled 4/5 words indepedently after 12 minutes, 1 word with cue) Awareness: Appears intact (resolution of symptoms) Safety/Judgment: Appears intact (allowing SlP to check to assure pt is approved to get up independently)    Comprehension  Auditory Comprehension Overall Auditory Comprehension: Appears within functional limits for tasks assessed Yes/No Questions: Not tested Commands: Within Functional Limits Conversation: Complex Visual Recognition/Discrimination Discrimination: Not tested Reading Comprehension Reading Status: Not tested    Expression Expression Primary Mode of Expression: Verbal Verbal Expression Overall Verbal Expression: Appears within functional limits for tasks assessed Initiation: No impairment Repetition: No impairment Naming: Not tested Pragmatics: No impairment Written Expression Dominant Hand: Right Written Expression: Not tested   Oral / Motor  Oral Motor/Sensory Function Overall Oral Motor/Sensory Function: Within functional limits Motor Speech Overall Motor Speech: Appears within functional limits for tasks assessed Respiration: Within functional limits Resonance: Within functional limits Articulation: Within functional limitis Motor Planning: Witnin functional limits   GO          Functional Assessment Tool Used: clinical judgement, portion of moca Functional Limitations: Motor speech Motor Speech Current Status [phone]): 0 percent impaired, limited or restricted Motor Speech  Goal Status [phone]):  0 percent impaired, limited or restricted Motor Speech Goal Status [id number]): 0 percent impaired, limited or restricted         Claudie Fisherman, [place] [facility] SLP [phone]

## 2015-09-04 NOTE — Progress Notes (Signed)
  Echocardiogram 2D Echocardiogram has been performed.  Tresa Res 09/04/2015, 11:41 AM

## 2015-09-05 LAB — HEMOGLOBIN A1C
Hgb A1c MFr Bld: 8.3 % — ABNORMAL HIGH (ref 4.8–5.6)
MEAN PLASMA GLUCOSE: 192 mg/dL

## 2016-01-22 ENCOUNTER — Other Ambulatory Visit: Payer: Self-pay | Admitting: Nephrology

## 2016-01-22 DIAGNOSIS — N183 Chronic kidney disease, stage 3 unspecified: Secondary | ICD-10-CM

## 2016-01-26 ENCOUNTER — Ambulatory Visit
Admission: RE | Admit: 2016-01-26 | Discharge: 2016-01-26 | Disposition: A | Discharge status: Home / Self Care | Payer: Medicaid Other | Source: Ambulatory Visit | Attending: Nephrology | Admitting: Nephrology

## 2016-01-26 DIAGNOSIS — N183 Chronic kidney disease, stage 3 unspecified: Secondary | ICD-10-CM

## 2019-10-27 ENCOUNTER — Other Ambulatory Visit: Payer: Self-pay

## 2019-10-27 ENCOUNTER — Encounter (HOSPITAL_COMMUNITY): Payer: Self-pay | Admitting: Emergency Medicine

## 2019-10-27 ENCOUNTER — Emergency Department (HOSPITAL_COMMUNITY)
Admission: EM | Admit: 2019-10-27 | Discharge: 2019-10-27 | Disposition: A | Discharge status: Home / Self Care | Payer: Medicaid Other | Attending: Emergency Medicine | Admitting: Emergency Medicine

## 2019-10-27 ENCOUNTER — Emergency Department (HOSPITAL_COMMUNITY): Payer: Medicaid Other

## 2019-10-27 DIAGNOSIS — Z79899 Other long term (current) drug therapy: Secondary | ICD-10-CM | POA: Insufficient documentation

## 2019-10-27 DIAGNOSIS — S61217A Laceration without foreign body of left little finger without damage to nail, initial encounter: Secondary | ICD-10-CM

## 2019-10-27 DIAGNOSIS — S6990XA Unspecified injury of unspecified wrist, hand and finger(s), initial encounter: Secondary | ICD-10-CM

## 2019-10-27 DIAGNOSIS — S61225A Laceration with foreign body of left ring finger without damage to nail, initial encounter: Secondary | ICD-10-CM | POA: Diagnosis not present

## 2019-10-27 DIAGNOSIS — W28XXXA Contact with powered lawn mower, initial encounter: Secondary | ICD-10-CM | POA: Diagnosis not present

## 2019-10-27 DIAGNOSIS — S6992XA Unspecified injury of left wrist, hand and finger(s), initial encounter: Secondary | ICD-10-CM | POA: Diagnosis present

## 2019-10-27 DIAGNOSIS — Z7901 Long term (current) use of anticoagulants: Secondary | ICD-10-CM | POA: Insufficient documentation

## 2019-10-27 DIAGNOSIS — Y999 Unspecified external cause status: Secondary | ICD-10-CM | POA: Insufficient documentation

## 2019-10-27 DIAGNOSIS — I10 Essential (primary) hypertension: Secondary | ICD-10-CM | POA: Diagnosis not present

## 2019-10-27 DIAGNOSIS — E119 Type 2 diabetes mellitus without complications: Secondary | ICD-10-CM | POA: Diagnosis not present

## 2019-10-27 DIAGNOSIS — Y93H2 Activity, gardening and landscaping: Secondary | ICD-10-CM | POA: Diagnosis not present

## 2019-10-27 DIAGNOSIS — Z23 Encounter for immunization: Secondary | ICD-10-CM | POA: Insufficient documentation

## 2019-10-27 DIAGNOSIS — Y92007 Garden or yard of unspecified non-institutional (private) residence as the place of occurrence of the external cause: Secondary | ICD-10-CM | POA: Diagnosis not present

## 2019-10-27 MED ORDER — LIDOCAINE HCL (PF) 1 % IJ SOLN
30.0000 mL | Freq: Once | INTRAMUSCULAR | Status: AC
  Administered 2019-10-27: 30 mL
  Filled 2019-10-27: qty 30

## 2019-10-27 MED ORDER — TETANUS-DIPHTH-ACELL PERTUSSIS 5-2.5-18.5 LF-MCG/0.5 IM SUSP
0.5000 mL | Freq: Once | INTRAMUSCULAR | Status: AC
Start: 2019-10-27 — End: 2019-10-27
  Administered 2019-10-27: 0.5 mL via INTRAMUSCULAR
  Filled 2019-10-27: qty 0.5

## 2019-10-27 MED ORDER — CEPHALEXIN 250 MG PO CAPS
500.0000 mg | ORAL_CAPSULE | Freq: Once | ORAL | Status: AC
  Administered 2019-10-27: 500 mg via ORAL
  Filled 2019-10-27: qty 2

## 2019-10-27 MED ORDER — CEPHALEXIN 500 MG PO CAPS: 500.0000 mg | ORAL_CAPSULE | Freq: Three times a day (TID) | ORAL | 0 refills | Status: DC

## 2019-10-27 NOTE — Progress Notes (Signed)
Orthopedic Tech Progress Note Patient Details:  Jeremiyah Cullens 1955-06-24 [id number]  Ortho Devices Type of Ortho Device: Finger splint Ortho Device/Splint Location: lue 4th and 5th finger splints applied. the pt had continued to try to move his splinted fingers. so i buddy taped the splinted fingers together. i alerted the dr to this. Ortho Device/Splint Interventions: Ordered, Application, Adjustment   Post Interventions Patient Tolerated: Well Instructions Provided: Care of device, Adjustment of device   Karolee Stamps 10/27/2019, 11:26 PM

## 2019-10-27 NOTE — ED Provider Notes (Signed)
[facility] EMERGENCY DEPARTMENT Provider Note   CSN: [id number] Arrival date & time: 10/27/19  1553     History Chief Complaint  Patient presents with  . Hand Pain    Gabriel Fowler is a 64 y.o. male presents to ER for evaluation of left ring and little finger injury that occurred immediately PTA. He was mowing his lawn and changing the belt when his fingers were injured with the blade.  He reports two wounds on the pads of his two fingers with local tenderness, bleeding has slowed down.  Unknown tetanus status. He is right hand dominant. Denies paresthesias distally. He washed wounds at home and came to ER for treatment.   HPI     Past Medical History:  Diagnosis Date  . Diabetes mellitus without complication ([place])   . Hypercholesteremia   . Hypertension     Patient Active Problem List   Diagnosis Date Noted  . Diabetes mellitus with nephropathy ([place])   . Chronic renal failure, stage 3 (moderate)   . Stroke-like symptom 09/03/2015  . TIA (transient ischemic attack) 09/03/2015  . Diabetes mellitus without complication ([place])   . Hypertension   . Hypercholesteremia     Past Surgical History:  Procedure Laterality Date  . EXTRA SKIN REMOVED FROM BACK OF NECK         Family History  Problem Relation Age of Onset  . Stroke Neg Hx   . Heart disease Neg Hx   . Cancer Neg Hx     Social History   Tobacco Use  . Smoking status: Never Smoker  . Smokeless tobacco: Never Used  Substance Use Topics  . Alcohol use: No  . Drug use: No    Home Medications Prior to Admission medications   Medication Sig Start Date End Date Taking? Authorizing Provider  atenolol (TENORMIN) 50 MG tablet Take 1 tablet by mouth daily. 08/25/15   [provider]  cephALEXin (KEFLEX) 500 MG capsule Take 1 capsule (500 mg total) by mouth 3 (three) times daily. 10/27/19   Kinnie Feil, PA-C  clopidogrel (PLAVIX) 75 MG tablet Take 1 tablet (75 mg total) by  mouth daily. 09/04/15   Barton Dubois, MD  glipiZIDE (GLUCOTROL) 10 MG tablet Take 2 tablets by mouth daily.  08/25/15   [provider]  metFORMIN (GLUCOPHAGE) 1000 MG tablet Take 1 tablet by mouth daily.  08/25/15   [provider]  NEXIUM 40 MG capsule Take 1 capsule by mouth daily. 08/25/15   [provider]  Omega 3-6-9 Fatty Acids (OMEGA 3-6-9 COMPLEX PO) Take 1 tablet by mouth daily.    [provider]  pravastatin (PRAVACHOL) 40 MG tablet Take 1 tablet (40 mg total) by mouth daily. 09/04/15   Barton Dubois, MD  valsartan (DIOVAN) 80 MG tablet Take 1 tablet by mouth daily. 08/31/15   [provider]  zolpidem (AMBIEN) 5 MG tablet Take 1 tablet by mouth at bedtime as needed for sleep.  06/16/15   [provider]    Allergies    Patient has no known allergies.  Review of Systems   Review of Systems  Skin: Positive for wound.  All other systems reviewed and are negative.   Physical Exam Updated Vital Signs BP (!) 167/83 (BP Location: Right Arm)   Pulse 71   Temp 98.4 F (36.9 C) (Oral)   Resp 16   SpO2 100%   Physical Exam Constitutional:      Appearance: He is well-developed.  HENT:     Head: Normocephalic.     Nose: Nose normal.  Eyes:     General: Lids are normal.  Cardiovascular:     Rate and Rhythm: Normal rate.  Pulmonary:     Effort: Pulmonary effort is normal. No respiratory distress.  Musculoskeletal:        General: Signs of injury present. Normal range of motion.     Cervical back: Normal range of motion.     Comments: Left ring finger: approx 4 cm irregular deep laceration horizontally across finger pad, hemostatic.  Patient has full flexion/extension at MCP, PIP and DIP.  Sensation to light touch intact in distal finger pad.  Left little finger:  approx 3-4 cm irregular deep laceration horizontally across finger pad, slow dark blood oozing from wound.  Patient has full flexion/extension at MCP, PIP and DIP.   Sensation to light touch intact in distal finger pad.   Neurological:     Mental Status: He is alert.  Psychiatric:        Behavior: Behavior normal.     ED Results / Procedures / Treatments   Labs (all labs ordered are listed, but only abnormal results are displayed) Labs Reviewed - No data to display  EKG None  Radiology DG Hand Complete Left  Result Date: 10/27/2019 CLINICAL DATA:  Lawnmower injury EXAM: LEFT HAND - COMPLETE 3+ VIEW COMPARISON:  None FINDINGS: Bandages overlie the ring finger and small finger distal portions of the phalanges. There is evidence of soft tissue injury about the ulnar aspect of the small finger, assessment limited by overlying dressings. Metallic density in the tip of the ring finger, lateral view suggest deep laceration perhaps even to the bone. No visible fracture. IMPRESSION: Extensive soft tissue injury to the small finger and ring finger of the LEFT hand without sign of fracture. Dressing limits bony detail. Electronically Signed   By: Zetta Bills M.D.   On: 10/27/2019 19:26    Procedures .Marland KitchenLaceration Repair  Date/Time: 10/27/2019 10:16 PM Performed by: Kinnie Feil, PA-C Authorized by: Kinnie Feil, PA-C   Consent:    Consent obtained:  Verbal   Consent given by:  Patient   Risks discussed:  Infection, pain, retained foreign body, need for additional repair, nerve damage and vascular damage   Alternatives discussed:  No treatment Anesthesia (see MAR for exact dosages):    Anesthesia method:  Local infiltration   Local anesthetic:  Lidocaine 1% w/o epi Laceration details:    Location:  Finger   Finger location:  L ring finger   Length (cm):  2 Repair type:    Repair type:  Complex (bone involvement, irregular, dirty wound) Pre-procedure details:    Preparation:  Patient was prepped and draped in usual sterile fashion and imaging obtained to evaluate for foreign bodies Exploration:    Limited defect created (wound  extended): no     Hemostasis achieved with:  Direct pressure   Wound extent: foreign bodies/material and underlying fracture (equivocal xray)     Foreign bodies/material:  Unknown    Contaminated: yes   Treatment:    Wound cleansed with: mix solution of iodine, peroxide, normal saline.   Amount of cleaning:  Extensive   Visualized foreign bodies/material removed: no     Debridement:  None   Undermining:  None   Scar revision: no   Skin repair:    Repair method:  Sutures   Suture size:  4-0   Wound skin closure material used:  ethilon.   Number of sutures:  4 Approximation:    Approximation:  Loose Post-procedure details:    Dressing:  Antibiotic ointment, non-adherent dressing, splint for protection and bulky dressing   Patient tolerance of procedure:  Tolerated well, no immediate complications .Marland KitchenLaceration Repair  Date/Time: 10/27/2019 10:20 PM Performed by: Kinnie Feil, PA-C Authorized by: Kinnie Feil, PA-C   Consent:    Consent obtained:  Verbal   Consent given by:  Patient   Risks discussed:  Infection, pain, nerve damage, vascular damage and need for additional repair   Alternatives discussed:  No treatment Anesthesia (see MAR for exact dosages):    Anesthesia method:  Local infiltration   Local anesthetic:  Lidocaine 1% w/o epi Laceration details:    Location:  Finger   Finger location:  L small finger   Length (cm):  4 Repair type:    Repair type:  Complex (bone involvement, dirty wound) Pre-procedure details:    Preparation:  Patient was prepped and draped in usual sterile fashion and imaging obtained to evaluate for foreign bodies Exploration:    Limited defect created (wound extended): no     Hemostasis achieved with:  Direct pressure   Wound exploration: wound explored through full range of motion and entire depth of wound probed and visualized     Wound extent: underlying fracture     Wound extent: no foreign bodies/material noted and no nerve  damage noted     Contaminated: yes   Treatment:    Wound cleansed with: solution of iodine, peroxide, normal saline.   Amount of cleaning:  Extensive   Irrigation method:  Pressure wash   Visualized foreign bodies/material removed: no     Debridement:  None   Undermining:  None   Scar revision: no   Skin repair:    Repair method:  Sutures   Suture size:  4-0   Suture technique: ethilon.   Number of sutures:  3 Approximation:    Approximation:  Loose Post-procedure details:    Dressing:  Antibiotic ointment, non-adherent dressing, bulky dressing and splint for protection   Patient tolerance of procedure:  Tolerated well, no immediate complications   (including critical care time)  Medications Ordered in ED Medications  Tdap (BOOSTRIX) injection 0.5 mL (0.5 mLs Intramuscular Given 10/27/19 1912)  lidocaine (PF) (XYLOCAINE) 1 % injection 30 mL (30 mLs Infiltration Given by Other 10/27/19 1909)  cephALEXin (KEFLEX) capsule 500 mg (500 mg Oral Given 10/27/19 2150)    ED Course  I have reviewed the triage vital signs and the nursing notes.  Pertinent labs & imaging results that were available during my care of the patient were reviewed by me and considered in my medical decision making (see chart for details).  Clinical Course as of Oct 27 2222  Sun Oct 27, 2019  2058 Called radiology regarding delay in x-ray.  Evette Doffing tech called [facility], radiologist seems to be on patient imaging. Results still pending    [CG]    Clinical Course User Index [CG] Kinnie Feil, PA-C   MDM Rules/Calculators/A&P                      Tetanus updated. No gross neuro or perfusion deficits.   2100: Delay in imaging, pending radiology review. I have reviewed x-rays and there seems to be fracture across both distal phalanxes with foreign body in soft tissue of ring finger.  Will consult hand surgery.   Spoke to Dr.  Ortman recommends thorough irrigation hearing, loose stitches and patient  to call his office to be seen tomorrow.  Lacerations were complex given irregularity, significant contamination and bone involvement.  Thoroughly irrigated under pressure irrigation.  X-ray shows possible foreign body in the ring finger, however after irrigation I was not able to palpate a any foreign body material.  We will discharge with antibiotic, wound care instructions.  Patient and son both understand he is to call Dr. Lequita Asal office tomorrow morning to confirm follow-up.  Return precautions given.  Patient is comfortable this. Final Clinical Impression(s) / ED Diagnoses Final diagnoses:  Laceration of left ring finger with foreign body without damage to nail, initial encounter  Laceration of left little finger without foreign body without damage to nail, initial encounter    Rx / DC Orders ED Discharge Orders         Ordered    cephALEXin (KEFLEX) 500 MG capsule  3 times daily     10/27/19 2211           Arlean Hopping 10/27/19 2224    Valarie Merino, MD 10/29/19 1141

## 2019-10-27 NOTE — Discharge Instructions (Addendum)
You were seen in the ER for lacerations on fingers  X-ray is equivocal, there may be injury to the bone  I spoke to Dr. Apolonio Schneiders, hand surgeon, who would like to see you in the office tomorrow for evaluation and repair.  Please call him in the morning to confirm your appointment.  Your wound was washed out here.  Temporary, loose stitches were placed to keep the wound edges closed and decrease bleeding.  Splints were placed to keep protected. Keep dressing and splint in place until you are seen by Dr Caralyn Guile   Please start taking antibiotic as prescribed.  Alternate ibuprofen or acetaminophen for pain.  Elevate the hand to decrease swelling.  Please call Dr. Lequita Asal office tomorrow morning to confirm an appointment for the next day or 2  Return to the ER if there is increased pain swelling redness bleeding warmth or fever

## 2019-10-27 NOTE — ED Triage Notes (Signed)
Pt presents with lac to left fifth and ring finger from a mower blade. Wound dressed prior to arrival, bleeding controlled at this time.

## 2020-06-10 ENCOUNTER — Ambulatory Visit (HOSPITAL_COMMUNITY)
Admission: EM | Admit: 2020-06-10 | Discharge: 2020-06-10 | Disposition: A | Discharge status: Home / Self Care | Payer: Medicaid Other

## 2020-06-10 ENCOUNTER — Emergency Department (HOSPITAL_COMMUNITY): Payer: Medicaid Other

## 2020-06-10 ENCOUNTER — Other Ambulatory Visit: Payer: Self-pay

## 2020-06-10 ENCOUNTER — Emergency Department (HOSPITAL_COMMUNITY)
Admission: EM | Admit: 2020-06-10 | Discharge: 2020-06-10 | Disposition: A | Discharge status: Home / Self Care | Payer: Medicaid Other | Attending: Emergency Medicine | Admitting: Emergency Medicine

## 2020-06-10 DIAGNOSIS — H5500 Unspecified nystagmus: Secondary | ICD-10-CM | POA: Diagnosis not present

## 2020-06-10 DIAGNOSIS — I129 Hypertensive chronic kidney disease with stage 1 through stage 4 chronic kidney disease, or unspecified chronic kidney disease: Secondary | ICD-10-CM | POA: Diagnosis not present

## 2020-06-10 DIAGNOSIS — Z7984 Long term (current) use of oral hypoglycemic drugs: Secondary | ICD-10-CM | POA: Diagnosis not present

## 2020-06-10 DIAGNOSIS — E1122 Type 2 diabetes mellitus with diabetic chronic kidney disease: Secondary | ICD-10-CM | POA: Insufficient documentation

## 2020-06-10 DIAGNOSIS — Z7902 Long term (current) use of antithrombotics/antiplatelets: Secondary | ICD-10-CM | POA: Insufficient documentation

## 2020-06-10 DIAGNOSIS — N183 Chronic kidney disease, stage 3 unspecified: Secondary | ICD-10-CM | POA: Insufficient documentation

## 2020-06-10 DIAGNOSIS — Z79899 Other long term (current) drug therapy: Secondary | ICD-10-CM | POA: Insufficient documentation

## 2020-06-10 DIAGNOSIS — R519 Headache, unspecified: Secondary | ICD-10-CM

## 2020-06-10 DIAGNOSIS — H4921 Sixth [abducent] nerve palsy, right eye: Secondary | ICD-10-CM

## 2020-06-10 DIAGNOSIS — H532 Diplopia: Secondary | ICD-10-CM

## 2020-06-10 LAB — APTT: aPTT: 31 seconds (ref 24–36)

## 2020-06-10 LAB — CBC
HCT: 45 % (ref 39.0–52.0)
Hemoglobin: 14.3 g/dL (ref 13.0–17.0)
MCH: 27.2 pg (ref 26.0–34.0)
MCHC: 31.8 g/dL (ref 30.0–36.0)
MCV: 85.7 fL (ref 80.0–100.0)
Platelets: 192 10*3/uL (ref 150–400)
RBC: 5.25 MIL/uL (ref 4.22–5.81)
RDW: 12.3 % (ref 11.5–15.5)
WBC: 9.3 10*3/uL (ref 4.0–10.5)
nRBC: 0 % (ref 0.0–0.2)

## 2020-06-10 LAB — DIFFERENTIAL
Abs Immature Granulocytes: 0.11 10*3/uL — ABNORMAL HIGH (ref 0.00–0.07)
Basophils Absolute: 0 10*3/uL (ref 0.0–0.1)
Basophils Relative: 0 %
Eosinophils Absolute: 0.3 10*3/uL (ref 0.0–0.5)
Eosinophils Relative: 3 %
Immature Granulocytes: 1 %
Lymphocytes Relative: 26 %
Lymphs Abs: 2.4 10*3/uL (ref 0.7–4.0)
Monocytes Absolute: 0.6 10*3/uL (ref 0.1–1.0)
Monocytes Relative: 6 %
Neutro Abs: 5.9 10*3/uL (ref 1.7–7.7)
Neutrophils Relative %: 64 %

## 2020-06-10 LAB — PROTIME-INR
INR: 1.1 (ref 0.8–1.2)
Prothrombin Time: 13.9 seconds (ref 11.4–15.2)

## 2020-06-10 LAB — COMPREHENSIVE METABOLIC PANEL
ALT: 15 U/L (ref 0–44)
AST: 12 U/L — ABNORMAL LOW (ref 15–41)
Albumin: 3.7 g/dL (ref 3.5–5.0)
Alkaline Phosphatase: 80 U/L (ref 38–126)
Anion gap: 11 (ref 5–15)
BUN: 41 mg/dL — ABNORMAL HIGH (ref 8–23)
CO2: 24 mmol/L (ref 22–32)
Calcium: 9.4 mg/dL (ref 8.9–10.3)
Chloride: 103 mmol/L (ref 98–111)
Creatinine, Ser: 2.24 mg/dL — ABNORMAL HIGH (ref 0.61–1.24)
GFR, Estimated: 32 mL/min — ABNORMAL LOW (ref >60)
Glucose, Bld: 220 mg/dL — ABNORMAL HIGH (ref 70–99)
Potassium: 5 mmol/L (ref 3.5–5.1)
Sodium: 138 mmol/L (ref 135–145)
Total Bilirubin: 0.5 mg/dL (ref 0.3–1.2)
Total Protein: 6.8 g/dL (ref 6.5–8.1)

## 2020-06-10 LAB — I-STAT CHEM 8, ED
BUN: 45 mg/dL — ABNORMAL HIGH (ref 8–23)
Calcium, Ion: 1.21 mmol/L (ref 1.15–1.40)
Chloride: 104 mmol/L (ref 98–111)
Creatinine, Ser: 2.1 mg/dL — ABNORMAL HIGH (ref 0.61–1.24)
Glucose, Bld: 214 mg/dL — ABNORMAL HIGH (ref 70–99)
HCT: 43 % (ref 39.0–52.0)
Hemoglobin: 14.6 g/dL (ref 13.0–17.0)
Potassium: 5 mmol/L (ref 3.5–5.1)
Sodium: 139 mmol/L (ref 135–145)
TCO2: 25 mmol/L (ref 22–32)

## 2020-06-10 MED ORDER — SODIUM CHLORIDE 0.9% FLUSH: 3.0000 mL | Freq: Once | INTRAVENOUS | Status: DC

## 2020-06-10 NOTE — ED Notes (Signed)
Provider aware of pt BP at discharge

## 2020-06-10 NOTE — Discharge Instructions (Addendum)
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  The right eye is not turning out laterally causing the double vision.  We did CT imaging and MRI today which did not show any evidence of acute stroke, bleeding, or tumor.  I spoke with neurology who recommended using a patch for comfort and preventing the double vision as well as follow-up with both ophthalmology as well as neurology.  Please continue taking your home medications for your blood pressure and diabetes.  Please rest and stay hydrated.  If any symptoms change or worsen, please return to the nearest emergency department.  Please be careful not to drive with the double vision.

## 2020-06-10 NOTE — ED Triage Notes (Signed)
Pt reports right eye crossed since Saturday. Pt also reports headache and blurred vision since Saturday. Pt ambulatory with cane. NAD at present. Seen by UC this AM and sent over for further eval.

## 2020-06-10 NOTE — ED Notes (Signed)
Pt transported to MRI 

## 2020-06-10 NOTE — ED Notes (Signed)
Pt left before being seen to go to the ED

## 2020-06-10 NOTE — ED Provider Notes (Signed)
[facility] EMERGENCY DEPARTMENT Provider Note   CSN: [id number] Arrival date & time: 06/10/20  1201     History Chief Complaint  Patient presents with  . Headache  . Stroke Symptoms    Keziah Drotar is a 64 y.o. male.  The history is provided by the patient and medical records. The history is limited by a language barrier. No language interpreter was used (daughter preferred to interpret).  Eye Problem Location:  Right eye Quality: double vision. Severity:  Severe Onset quality:  Sudden Timing:  Constant Progression:  Unchanged Chronicity:  New Relieved by:  Nothing Worsened by:  Nothing Associated symptoms: double vision and headaches   Associated symptoms: no blurred vision, no crusting, no decreased vision, no discharge, no facial rash, no inflammation, no itching, no nausea, no numbness, no photophobia, no redness, no swelling, no tearing, no tingling, no vomiting and no weakness        Past Medical History:  Diagnosis Date  . Diabetes mellitus without complication ([place])   . Hypercholesteremia   . Hypertension     Patient Active Problem List   Diagnosis Date Noted  . Diabetes mellitus with nephropathy ([place])   . Chronic renal failure, stage 3 (moderate) ([facility])   . Stroke-like symptom 09/03/2015  . TIA (transient ischemic attack) 09/03/2015  . Diabetes mellitus without complication ([place])   . Hypertension   . Hypercholesteremia     Past Surgical History:  Procedure Laterality Date  . EXTRA SKIN REMOVED FROM BACK OF NECK         Family History  Problem Relation Age of Onset  . Stroke Neg Hx   . Heart disease Neg Hx   . Cancer Neg Hx     Social History   Tobacco Use  . Smoking status: Never Smoker  . Smokeless tobacco: Never Used  Substance Use Topics  . Alcohol use: No  . Drug use: No    Home Medications Prior to Admission medications   Medication Sig Start Date End Date Taking? Authorizing Provider  atenolol  (TENORMIN) 50 MG tablet Take 1 tablet by mouth daily. 08/25/15   [provider]  cephALEXin (KEFLEX) 500 MG capsule Take 1 capsule (500 mg total) by mouth 3 (three) times daily. 10/27/19   Kinnie Feil, PA-C  clopidogrel (PLAVIX) 75 MG tablet Take 1 tablet (75 mg total) by mouth daily. 09/04/15   Barton Dubois, MD  glipiZIDE (GLUCOTROL) 10 MG tablet Take 2 tablets by mouth daily.  08/25/15   [provider]  metFORMIN (GLUCOPHAGE) 1000 MG tablet Take 1 tablet by mouth daily.  08/25/15   [provider]  NEXIUM 40 MG capsule Take 1 capsule by mouth daily. 08/25/15   [provider]  Omega 3-6-9 Fatty Acids (OMEGA 3-6-9 COMPLEX PO) Take 1 tablet by mouth daily.    [provider]  pravastatin (PRAVACHOL) 40 MG tablet Take 1 tablet (40 mg total) by mouth daily. 09/04/15   Barton Dubois, MD  valsartan (DIOVAN) 80 MG tablet Take 1 tablet by mouth daily. 08/31/15   [provider]  zolpidem (AMBIEN) 5 MG tablet Take 1 tablet by mouth at bedtime as needed for sleep.  06/16/15   [provider]    Allergies    Patient has no known allergies.  Review of Systems   Review of Systems  Constitutional: Negative for chills, diaphoresis, fatigue and fever.  HENT: Negative for congestion.   Eyes: Positive for double vision and visual disturbance.  Negative for blurred vision, photophobia, discharge, redness and itching.  Respiratory: Negative for cough, chest tightness, shortness of breath and wheezing.   Cardiovascular: Negative for chest pain, palpitations and leg swelling.  Gastrointestinal: Negative for abdominal pain, constipation, diarrhea, nausea and vomiting.  Genitourinary: Negative for flank pain.  Musculoskeletal: Negative for back pain, neck pain and neck stiffness.  Skin: Negative for rash and wound.  Neurological: Positive for headaches. Negative for dizziness, tingling, seizures, syncope, facial asymmetry, speech difficulty,  weakness, light-headedness and numbness.  Psychiatric/Behavioral: Negative for agitation and confusion.  All other systems reviewed and are negative.   Physical Exam Updated Vital Signs BP (!) 168/92 (BP Location: Right Arm)   Pulse 66   Temp 98 F (36.7 C) (Oral)   Resp 16   SpO2 98%   Physical Exam Vitals and nursing note reviewed.  Constitutional:      General: He is not in acute distress.    Appearance: He is well-developed and well-nourished. He is not ill-appearing, toxic-appearing or diaphoretic.  HENT:     Head: Normocephalic and atraumatic.     Mouth/Throat:     Mouth: Mucous membranes are moist.  Eyes:     General: No visual field deficit.    Extraocular Movements:     Right eye: Abnormal extraocular motion present.     Left eye: Nystagmus present. Normal extraocular motion.     Conjunctiva/sclera: Conjunctivae normal.     Pupils: Pupils are equal, round, and reactive to light. Pupils are equal.  Cardiovascular:     Rate and Rhythm: Normal rate and regular rhythm.     Heart sounds: No murmur heard.   Pulmonary:     Effort: Pulmonary effort is normal. No respiratory distress.     Breath sounds: Normal breath sounds. No wheezing, rhonchi or rales.  Chest:     Chest wall: No tenderness.  Abdominal:     Palpations: Abdomen is soft.     Tenderness: There is no abdominal tenderness.  Musculoskeletal:        General: No tenderness or edema.     Cervical back: Neck supple.  Skin:    General: Skin is warm and dry.     Capillary Refill: Capillary refill takes less than 2 seconds.     Coloration: Skin is not pale.  Neurological:     Mental Status: He is alert and oriented to person, place, and time.     GCS: GCS eye subscore is 4. GCS verbal subscore is 5. GCS motor subscore is 6.     Cranial Nerves: No facial asymmetry.     Sensory: No sensory deficit.     Motor: No weakness.  Psychiatric:        Mood and Affect: Mood and affect and mood normal.     ED  Results / Procedures / Treatments   Labs (all labs ordered are listed, but only abnormal results are displayed) Labs Reviewed  DIFFERENTIAL - Abnormal; Notable for the following components:      Result Value   Abs Immature Granulocytes 0.11 (*)    All other components within normal limits  COMPREHENSIVE METABOLIC PANEL - Abnormal; Notable for the following components:   Glucose, Bld 220 (*)    BUN 41 (*)    Creatinine, Ser 2.24 (*)    AST 12 (*)    GFR, Estimated 32 (*)    All other components within normal limits  I-STAT CHEM 8, ED - Abnormal; Notable for the following components:  BUN 45 (*)    Creatinine, Ser 2.10 (*)    Glucose, Bld 214 (*)    All other components within normal limits  PROTIME-INR  APTT  CBC  CBG MONITORING, ED    EKG EKG Interpretation  Date/Time:  Wednesday June 10 2020 12:45:33 EST Ventricular Rate:  70 PR Interval:  170 QRS Duration: 92 QT Interval:  378 QTC Calculation: 408 R Axis:   86 Text Interpretation: Normal sinus rhythm Normal ECG When compared to prior, similar appearance. No STEMI Confirmed by Antony Blackbird [id number]) on 06/10/2020 4:34:54 PM   Radiology CT HEAD WO CONTRAST  Result Date: 06/10/2020 CLINICAL DATA:  Headache and blurred vision since Saturday EXAM: CT HEAD WITHOUT CONTRAST TECHNIQUE: Contiguous axial images were obtained from the base of the skull through the vertex without intravenous contrast. COMPARISON:  09/03/2015 FINDINGS: Brain: Similar minimal white matter microvascular intracranial changes. No acute intracranial hemorrhage, mass lesion, new infarction, midline shift, herniation, hydrocephalus, or extra-axial fluid collection. No focal mass effect or edema. Cisterns are patent. No cerebellar abnormality. Vascular: No hyperdense vessel or unexpected calcification. Skull: Normal. Negative for fracture or focal lesion. Sinuses/Orbits: Orbits are symmetric and unremarkable. Scattered minor sinus mucosal thickening. No  sinus air-fluid level. Other: None. IMPRESSION: Stable exam.  No acute intracranial abnormality by noncontrast CT. Electronically Signed   By: Jerilynn Mages.  Shick M.D.   On: 06/10/2020 13:38   MR BRAIN WO CONTRAST  Result Date: 06/10/2020 CLINICAL DATA:  Diplopia, right abducens palsy EXAM: MRI HEAD WITHOUT CONTRAST TECHNIQUE: Multiplanar, multiecho pulse sequences of the brain and surrounding structures were obtained without intravenous contrast. COMPARISON:  2017 FINDINGS: Brain: There is no acute infarction or intracranial hemorrhage. There is no intracranial mass, mass effect, or edema. There is no hydrocephalus or extra-axial fluid collection. Prominence of the ventricles and sulci reflects generalized parenchymal volume loss of which has mildly progressed since 2017. Patchy small foci of T2 hyperintensity in the supratentorial and pontine white matter are nonspecific but probably reflect minor chronic microvascular ischemic changes also slightly increased since 2017 (this may be partially due to differences in technique). Vascular: Major vessel flow voids at the skull base are preserved. Skull and upper cervical spine: Normal marrow signal is preserved. Sinuses/Orbits: Mild mucosal thickening.  Orbits are unremarkable. Other: Incidental note is made of a partially empty sella. Mastoid air cells are clear. IMPRESSION: No acute infarction, hemorrhage, or mass. Minor chronic microvascular ischemic changes. Electronically Signed   By: Macy Mis M.D.   On: 06/10/2020 18:18    Procedures Procedures (including critical care time)  Medications Ordered in ED Medications  sodium chloride flush (NS) 0.9 % injection 3 mL (3 mLs Intravenous Not Given 06/10/20 1635)    ED Course  I have reviewed the triage vital signs and the nursing notes.  Pertinent labs & imaging results that were available during my care of the patient were reviewed by me and considered in my medical decision making (see chart for  details).    MDM Rules/Calculators/A&P                          Heman Que is a 64 y.o. male with a past medical history significant for diabetes, hypertension, hypercholesterolemia, and chronic kidney disease who presents for vision changes and some headaches.  Patient is accompanied by family who they requested provide interpretation services.  Patient says that since Saturday afternoon, he has had double vision.  He also has been  having some headaches but is currently headache free.  He reports no other neurologic complaints including no speech difficulties, facial droop, nausea, vomiting, numbness, tingling, weakness of extremities or difficulty with ambulation.  He only has the double vision and some intermittent headaches.  He was going to go to an urgent care today but left before they could see him to come here for further evaluation.  He denies history of stroke but Chart review shows possible TIA in the past.  On exam, patient does have what appears to be a right abducens palsy.  His right I cannot go outward but it can otherwise move normally.  His pupils are symmetric and reactive.  He had intact visual fields.  He did not have blurry vision when one eye was covered.  Lungs were clear and chest was nontender.  Abdomen is nontender.  Blood pressure is waxing and waning here in the emergency department but he is otherwise symptom-free aside from the double vision.  Normal finger-nose-finger testing bilaterally.  Patient resting comfortably.  Patient had some labs done in triage including a image of a head CT.  Head CT shows chronic microvascular changes but no acute stroke.  Patient's labs showed elevated creatinine from prior up to 2.24.  GFR is 32 so he would not be a candidate to get contrasted imaging at this time.  CBC reassuring.  Metabolic panel otherwise showed no significant electrolyte abnormalities.  He is hyperglycemic at 220 but he is a diabetic he reports.    I touch base  with neurology who initially recommended MRI brain with and without with thin slices of the brainstem however, given his GFR, they feel MRI without will be the most beneficial.  MRI was ordered to further evaluate.  I suspect this may just be from microvascular changes from his diabetes however we will make sure there is no stroke tonight.  Anticipate reassessment after MRI.  9:47 PM MRI shows no acute stroke, hemorrhage, or tumor.  Neurology felt this was likely related to diabetes and microvascular ischemia and changes.  They felt it was appropriate for patching his eye and outpatient follow-up with neurology and ophthalmology.  Patient was given an eye patch which he placed and agrees with the outpatient follow-up.  He will take his home blood pressure and glucose medicines.  He will maintain hydration and understands return precautions for new or worsened symptoms.  He had no other questions or concerns and I spoke with the patient's family on the phone who agreed with the plan.  Patient will be discharged for outpatient follow-up.  He was encouraged not to drive with a double vision.   Final Clinical Impression(s) / ED Diagnoses Final diagnoses:  Right abducens nerve palsy  Intermittent headache  Diplopia    Rx / DC Orders ED Discharge Orders    None      Clinical Impression: 1. Right abducens nerve palsy   2. Intermittent headache   3. Diplopia     Disposition: Discharge  Condition: Good  I have discussed the results, Dx and Tx plan with the pt(& family if present). He/she/they expressed understanding and agree(s) with the plan. Discharge instructions discussed at great length. Strict return precautions discussed and pt &/or family have verbalized understanding of the instructions. No further questions at time of discharge.    New Prescriptions   No medications on file    Follow Up: [place]     [place]  [id number] [phone]  with neurology  Darleen Crocker, [address] [place] [id number] [phone]   with ophthalmology  [facility] [address] [phone] mc [place]       Kevron Patella, Gwenyth Allegra, MD 06/10/20 [phone]

## 2021-02-18 ENCOUNTER — Emergency Department (HOSPITAL_COMMUNITY)
Admission: EM | Admit: 2021-02-18 | Discharge: 2021-02-18 | Discharge status: Against Medical Advice | Payer: Medicare Other

## 2021-02-18 ENCOUNTER — Ambulatory Visit (HOSPITAL_COMMUNITY)
Admission: EM | Admit: 2021-02-18 | Discharge: 2021-02-18 | Disposition: A | Discharge status: Home / Self Care | Payer: Medicare Other | Attending: Physician Assistant | Admitting: Physician Assistant

## 2021-02-18 ENCOUNTER — Other Ambulatory Visit: Payer: Self-pay

## 2021-02-18 ENCOUNTER — Ambulatory Visit (INDEPENDENT_AMBULATORY_CARE_PROVIDER_SITE_OTHER): Payer: Medicare Other

## 2021-02-18 ENCOUNTER — Encounter (HOSPITAL_COMMUNITY): Payer: Self-pay

## 2021-02-18 DIAGNOSIS — M79645 Pain in left finger(s): Secondary | ICD-10-CM | POA: Diagnosis not present

## 2021-02-18 DIAGNOSIS — S6992XA Unspecified injury of left wrist, hand and finger(s), initial encounter: Secondary | ICD-10-CM

## 2021-02-18 NOTE — Discharge Instructions (Addendum)
No fracture on x-ray.  Recommend Tylenol or ibuprofen if needed for pain.  Follow-up with any further concerns.

## 2021-02-18 NOTE — ED Provider Notes (Signed)
[facility]    CSN: [id number] Arrival date & time: 02/18/21  1411      History   Chief Complaint Chief Complaint  Patient presents with   Finger Injury    HPI Gabriel Fowler is a 65 y.o. male.   Patient here today for evaluation of left little finger injury that occurred yesterday.  He reports that he was mowing his lawn and his finger was hyper-abducted.  He has had pain and mild swelling since that time.  He denies any numbness or tingling.  Has not tried any medication for symptoms.  He does have finger splinted at this time.  The history is provided by the patient.   Past Medical History:  Diagnosis Date   Diabetes mellitus without complication ([place])    Hypercholesteremia    Hypertension     Patient Active Problem List   Diagnosis Date Noted   Diabetes mellitus with nephropathy ([place])    Chronic renal failure, stage 3 (moderate) ([place])    Stroke-like symptom 09/03/2015   TIA (transient ischemic attack) 09/03/2015   Diabetes mellitus without complication ([place])    Hypertension    Hypercholesteremia     Past Surgical History:  Procedure Laterality Date   EXTRA SKIN REMOVED FROM BACK OF NECK         Home Medications    Prior to Admission medications   Medication Sig Start Date End Date Taking? Authorizing Provider  atenolol (TENORMIN) 50 MG tablet Take 1 tablet by mouth daily. 08/25/15   [provider]  clopidogrel (PLAVIX) 75 MG tablet Take 1 tablet (75 mg total) by mouth daily. 09/04/15   Barton Dubois, MD  glipiZIDE (GLUCOTROL) 10 MG tablet Take 2 tablets by mouth daily.  08/25/15   [provider]  metFORMIN (GLUCOPHAGE) 1000 MG tablet Take 1 tablet by mouth daily.  08/25/15   [provider]  NEXIUM 40 MG capsule Take 1 capsule by mouth daily. 08/25/15   [provider]  Omega 3-6-9 Fatty Acids (OMEGA 3-6-9 COMPLEX PO) Take 1 tablet by mouth daily.    [provider]  pravastatin (PRAVACHOL) 40 MG  tablet Take 1 tablet (40 mg total) by mouth daily. 09/04/15   Barton Dubois, MD  valsartan (DIOVAN) 80 MG tablet Take 1 tablet by mouth daily. 08/31/15   [provider]  zolpidem (AMBIEN) 5 MG tablet Take 1 tablet by mouth at bedtime as needed for sleep.  06/16/15   [provider]    Family History Family History  Problem Relation Age of Onset   Stroke Neg Hx    Heart disease Neg Hx    Cancer Neg Hx     Social History Social History   Tobacco Use   Smoking status: Never   Smokeless tobacco: Never  Substance Use Topics   Alcohol use: No   Drug use: No     Allergies   Patient has no known allergies.   Review of Systems Review of Systems  Constitutional:  Negative for chills and fever.  Eyes:  Negative for discharge and redness.  Respiratory:  Negative for shortness of breath.   Musculoskeletal:  Positive for arthralgias and joint swelling.  Neurological:  Negative for numbness.    Physical Exam Triage Vital Signs ED Triage Vitals  Enc Vitals Group     BP 02/18/21 1503 (!) 187/92     Pulse Rate 02/18/21 1503 72     Resp 02/18/21 1503 18     Temp 02/18/21  1503 98.3 F (36.8 C)     Temp Source 02/18/21 1503 Oral     SpO2 02/18/21 1503 98 %     Weight --      Height --      Head Circumference --      Peak Flow --      Pain Score 02/18/21 1504 2     Pain Loc --      Pain Edu? --      Excl. in [place]? --    No data found.  Updated Vital Signs BP (!) 187/92 (BP Location: Left Arm)   Pulse 72   Temp 98.3 F (36.8 C) (Oral)   Resp 18   SpO2 98%     Physical Exam Vitals and nursing note reviewed.  Constitutional:      General: He is not in acute distress.    Appearance: Normal appearance. He is not ill-appearing.  HENT:     Head: Normocephalic and atraumatic.  Eyes:     Conjunctiva/sclera: Conjunctivae normal.  Cardiovascular:     Rate and Rhythm: Normal rate.  Pulmonary:     Effort: Pulmonary effort is normal.  Musculoskeletal:      Comments: Diffuse swelling to left fifth finger.  Mild tenderness to palpation at PCP joint.  Range of motion of the fifth finger limited due to swelling.  Skin:    General: Skin is warm and dry.     Capillary Refill: Normal cap refill to left fifth finger Neurological:     Mental Status: He is alert.     Comments: Gross sensation intact to distal left 5th finger.  Psychiatric:        Mood and Affect: Mood normal.        Thought Content: Thought content normal.     UC Treatments / Results  Labs (all labs ordered are listed, but only abnormal results are displayed) Labs Reviewed - No data to display  EKG   Radiology DG Finger Little Left  Result Date: 02/18/2021 CLINICAL DATA:  Left small finger injury yesterday. EXAM: LEFT LITTLE FINGER 2+V COMPARISON:  Left hand x-rays dated Oct 27, 2019. FINDINGS: There is no evidence of fracture or dislocation. There is no evidence of arthropathy or other focal bone abnormality. Soft tissues are unremarkable. IMPRESSION: Negative. Electronically Signed   By: Titus Dubin M.D.   On: 02/18/2021 15:43    Procedures Procedures (including critical care time)  Medications Ordered in UC Medications - No data to display  Initial Impression / Assessment and Plan / UC Course  I have reviewed the triage vital signs and the nursing notes.  Pertinent labs & imaging results that were available during my care of the patient were reviewed by me and considered in my medical decision making (see chart for details).  X-ray without fracture.  Recommended treatment with ibuprofen or Tylenol if needed for pain.  Encouraged follow-up with any further concerns.  Final Clinical Impressions(s) / UC Diagnoses   Final diagnoses:  Injury of finger of left hand, initial encounter     Discharge Instructions      No fracture on x-ray.  Recommend Tylenol or ibuprofen if needed for pain.  Follow-up with any further concerns.     ED Prescriptions   None     PDMP not reviewed this encounter.   Francene Finders, PA-C 02/18/21 1554

## 2021-02-18 NOTE — ED Notes (Signed)
Pts family advised staff they were taking him to Urgent care.

## 2021-02-18 NOTE — ED Triage Notes (Signed)
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 Pt has a finger splint applied.

## 2021-03-01 ENCOUNTER — Emergency Department (HOSPITAL_COMMUNITY): Payer: Medicare Other

## 2021-03-01 ENCOUNTER — Encounter (HOSPITAL_COMMUNITY): Payer: Self-pay | Admitting: Emergency Medicine

## 2021-03-01 ENCOUNTER — Inpatient Hospital Stay (HOSPITAL_COMMUNITY)
Admission: EM | Admit: 2021-03-01 | Discharge: 2021-03-05 | DRG: 177 | Disposition: A | Discharge status: Home / Self Care | Payer: Medicare Other | Attending: Internal Medicine | Admitting: Internal Medicine

## 2021-03-01 ENCOUNTER — Other Ambulatory Visit: Payer: Self-pay

## 2021-03-01 DIAGNOSIS — E1165 Type 2 diabetes mellitus with hyperglycemia: Secondary | ICD-10-CM | POA: Diagnosis present

## 2021-03-01 DIAGNOSIS — I130 Hypertensive heart and chronic kidney disease with heart failure and stage 1 through stage 4 chronic kidney disease, or unspecified chronic kidney disease: Secondary | ICD-10-CM | POA: Diagnosis present

## 2021-03-01 DIAGNOSIS — Z7984 Long term (current) use of oral hypoglycemic drugs: Secondary | ICD-10-CM

## 2021-03-01 DIAGNOSIS — I10 Essential (primary) hypertension: Secondary | ICD-10-CM | POA: Diagnosis present

## 2021-03-01 DIAGNOSIS — Z28310 Unvaccinated for covid-19: Secondary | ICD-10-CM

## 2021-03-01 DIAGNOSIS — N179 Acute kidney failure, unspecified: Secondary | ICD-10-CM | POA: Diagnosis not present

## 2021-03-01 DIAGNOSIS — E1122 Type 2 diabetes mellitus with diabetic chronic kidney disease: Secondary | ICD-10-CM | POA: Diagnosis present

## 2021-03-01 DIAGNOSIS — N183 Chronic kidney disease, stage 3 unspecified: Secondary | ICD-10-CM | POA: Diagnosis present

## 2021-03-01 DIAGNOSIS — Z79899 Other long term (current) drug therapy: Secondary | ICD-10-CM

## 2021-03-01 DIAGNOSIS — I5032 Chronic diastolic (congestive) heart failure: Secondary | ICD-10-CM | POA: Diagnosis present

## 2021-03-01 DIAGNOSIS — R32 Unspecified urinary incontinence: Secondary | ICD-10-CM | POA: Diagnosis present

## 2021-03-01 DIAGNOSIS — R509 Fever, unspecified: Secondary | ICD-10-CM

## 2021-03-01 DIAGNOSIS — E119 Type 2 diabetes mellitus without complications: Secondary | ICD-10-CM

## 2021-03-01 DIAGNOSIS — E7800 Pure hypercholesterolemia, unspecified: Secondary | ICD-10-CM | POA: Diagnosis present

## 2021-03-01 DIAGNOSIS — N184 Chronic kidney disease, stage 4 (severe): Secondary | ICD-10-CM | POA: Diagnosis present

## 2021-03-01 DIAGNOSIS — Z8673 Personal history of transient ischemic attack (TIA), and cerebral infarction without residual deficits: Secondary | ICD-10-CM

## 2021-03-01 DIAGNOSIS — A4189 Other specified sepsis: Secondary | ICD-10-CM | POA: Diagnosis present

## 2021-03-01 DIAGNOSIS — J9601 Acute respiratory failure with hypoxia: Secondary | ICD-10-CM | POA: Diagnosis present

## 2021-03-01 DIAGNOSIS — U071 COVID-19: Principal | ICD-10-CM | POA: Diagnosis present

## 2021-03-01 DIAGNOSIS — Z7902 Long term (current) use of antithrombotics/antiplatelets: Secondary | ICD-10-CM

## 2021-03-01 DIAGNOSIS — J1282 Pneumonia due to coronavirus disease 2019: Secondary | ICD-10-CM | POA: Diagnosis present

## 2021-03-01 DIAGNOSIS — G629 Polyneuropathy, unspecified: Secondary | ICD-10-CM | POA: Diagnosis present

## 2021-03-01 DIAGNOSIS — N1832 Chronic kidney disease, stage 3b: Secondary | ICD-10-CM | POA: Diagnosis present

## 2021-03-01 LAB — CBC WITH DIFFERENTIAL/PLATELET
Abs Immature Granulocytes: 0.07 10*3/uL (ref 0.00–0.07)
Basophils Absolute: 0 10*3/uL (ref 0.0–0.1)
Basophils Relative: 0 %
Eosinophils Absolute: 0 10*3/uL (ref 0.0–0.5)
Eosinophils Relative: 0 %
HCT: 44.9 % (ref 39.0–52.0)
Hemoglobin: 14.1 g/dL (ref 13.0–17.0)
Immature Granulocytes: 1 %
Lymphocytes Relative: 6 %
Lymphs Abs: 0.9 10*3/uL (ref 0.7–4.0)
MCH: 27.3 pg (ref 26.0–34.0)
MCHC: 31.4 g/dL (ref 30.0–36.0)
MCV: 86.8 fL (ref 80.0–100.0)
Monocytes Absolute: 1.1 10*3/uL — ABNORMAL HIGH (ref 0.1–1.0)
Monocytes Relative: 8 %
Neutro Abs: 11.4 10*3/uL — ABNORMAL HIGH (ref 1.7–7.7)
Neutrophils Relative %: 85 %
Platelets: 170 10*3/uL (ref 150–400)
RBC: 5.17 MIL/uL (ref 4.22–5.81)
RDW: 13.1 % (ref 11.5–15.5)
WBC: 13.5 10*3/uL — ABNORMAL HIGH (ref 4.0–10.5)
nRBC: 0 % (ref 0.0–0.2)

## 2021-03-01 LAB — COMPREHENSIVE METABOLIC PANEL
ALT: 23 U/L (ref 0–44)
AST: 23 U/L (ref 15–41)
Albumin: 4.2 g/dL (ref 3.5–5.0)
Alkaline Phosphatase: 138 U/L — ABNORMAL HIGH (ref 38–126)
Anion gap: 14 (ref 5–15)
BUN: 40 mg/dL — ABNORMAL HIGH (ref 8–23)
CO2: 21 mmol/L — ABNORMAL LOW (ref 22–32)
Calcium: 9.1 mg/dL (ref 8.9–10.3)
Chloride: 99 mmol/L (ref 98–111)
Creatinine, Ser: 3.29 mg/dL — ABNORMAL HIGH (ref 0.61–1.24)
GFR, Estimated: 20 mL/min — ABNORMAL LOW (ref >60)
Glucose, Bld: 158 mg/dL — ABNORMAL HIGH (ref 70–99)
Potassium: 4.5 mmol/L (ref 3.5–5.1)
Sodium: 134 mmol/L — ABNORMAL LOW (ref 135–145)
Total Bilirubin: 0.9 mg/dL (ref 0.3–1.2)
Total Protein: 7.7 g/dL (ref 6.5–8.1)

## 2021-03-01 LAB — ETHANOL: Alcohol, Ethyl (B): <10 mg/dL (ref <10)

## 2021-03-01 LAB — APTT: aPTT: 34 seconds (ref 24–36)

## 2021-03-01 LAB — PROTIME-INR
INR: 1.2 (ref 0.8–1.2)
Prothrombin Time: 14.9 seconds (ref 11.4–15.2)

## 2021-03-01 LAB — LACTIC ACID, PLASMA: Lactic Acid, Venous: 1.5 mmol/L (ref 0.5–1.9)

## 2021-03-01 MED ORDER — LIDOCAINE-EPINEPHRINE (PF) 2 %-1:200000 IJ SOLN
10.0000 mL | Freq: Once | INTRAMUSCULAR | Status: DC
  Filled 2021-03-01: qty 20

## 2021-03-01 MED ORDER — LACTATED RINGERS IV BOLUS (SEPSIS): 1000.0000 mL | Freq: Once | INTRAVENOUS | Status: DC

## 2021-03-01 MED ORDER — METRONIDAZOLE 500 MG/100ML IV SOLN
500.0000 mg | Freq: Once | INTRAVENOUS | Status: AC
  Administered 2021-03-01: 500 mg via INTRAVENOUS
  Filled 2021-03-01: qty 100

## 2021-03-01 MED ORDER — SODIUM CHLORIDE 0.9 % IV SOLN
2.0000 g | Freq: Once | INTRAVENOUS | Status: AC
  Administered 2021-03-01: 2 g via INTRAVENOUS
  Filled 2021-03-01: qty 2

## 2021-03-01 MED ORDER — VANCOMYCIN HCL 2000 MG/400ML IV SOLN
2000.0000 mg | Freq: Once | INTRAVENOUS | Status: AC
Start: 2021-03-01 — End: 2021-03-02
  Administered 2021-03-02: 2000 mg via INTRAVENOUS
  Filled 2021-03-01: qty 400

## 2021-03-01 MED ORDER — ACETAMINOPHEN 500 MG PO TABS
1000.0000 mg | ORAL_TABLET | Freq: Once | ORAL | Status: AC
  Administered 2021-03-01: 1000 mg via ORAL
  Filled 2021-03-01: qty 2

## 2021-03-01 MED ORDER — LACTATED RINGERS IV SOLN: INTRAVENOUS | Status: DC

## 2021-03-01 MED ORDER — LACTATED RINGERS IV BOLUS (SEPSIS)
1000.0000 mL | Freq: Once | INTRAVENOUS | Status: DC
Start: 2021-03-01 — End: 2021-03-01
  Administered 2021-03-01: 1000 mL via INTRAVENOUS

## 2021-03-01 NOTE — Sepsis Progress Note (Signed)
Monitoring for code sepsis protocol. 

## 2021-03-01 NOTE — ED Provider Notes (Signed)
[place] EMERGENCY DEPARTMENT Provider Note   CSN: [id number] Arrival date & time: 03/01/21  1858     History Chief Complaint  Patient presents with   Altered Mental Status    Gabriel Fowler is a 65 y.o. male with a hx of hypertension, hypercholesterolemia, DM, CKD, and prior TIA who presents to the ED for evaluation of AMS that began today.   History primarily provided by patient's daughter- she relays this morning patient woke up hot to the touch with a fever, confusion, cough, and increased urinary frequency. Describes confusion as making statements that do not make much sense. Cough is productive. Urinary frequency further described by patient as urgency, he is having trouble getting to the bathroom on time per him and his daughter. No alleviating/aggravating factors to his sxs. His daughter is sick with somewhat similar sxs. Patient denies pain, vomiting, diarrhea, or dyspnea. Level 5 caveat applies secondary to AMS.   Interpretor utilized throughout encounter to communicate with patient.   HPI     Past Medical History:  Diagnosis Date   Diabetes mellitus without complication ([place])    Hypercholesteremia    Hypertension     Patient Active Problem List   Diagnosis Date Noted   Diabetes mellitus with nephropathy ([place])    Chronic renal failure, stage 3 (moderate) (HCC)    Stroke-like symptom 09/03/2015   TIA (transient ischemic attack) 09/03/2015   Diabetes mellitus without complication (HCC)    Hypertension    Hypercholesteremia     Past Surgical History:  Procedure Laterality Date   EXTRA SKIN REMOVED FROM BACK OF NECK         Family History  Problem Relation Age of Onset   Stroke Neg Hx    Heart disease Neg Hx    Cancer Neg Hx     Social History   Tobacco Use   Smoking status: Never   Smokeless tobacco: Never  Substance Use Topics   Alcohol use: No   Drug use: No    Home Medications Prior to Admission medications    Medication Sig Start Date End Date Taking? Authorizing Provider  atenolol (TENORMIN) 50 MG tablet Take 1 tablet by mouth daily. 08/25/15   [provider]  clopidogrel (PLAVIX) 75 MG tablet Take 1 tablet (75 mg total) by mouth daily. 09/04/15   Barton Dubois, MD  glipiZIDE (GLUCOTROL) 10 MG tablet Take 2 tablets by mouth daily.  08/25/15   [provider]  metFORMIN (GLUCOPHAGE) 1000 MG tablet Take 1 tablet by mouth daily.  08/25/15   [provider]  NEXIUM 40 MG capsule Take 1 capsule by mouth daily. 08/25/15   [provider]  Omega 3-6-9 Fatty Acids (OMEGA 3-6-9 COMPLEX PO) Take 1 tablet by mouth daily.    [provider]  pravastatin (PRAVACHOL) 40 MG tablet Take 1 tablet (40 mg total) by mouth daily. 09/04/15   Barton Dubois, MD  valsartan (DIOVAN) 80 MG tablet Take 1 tablet by mouth daily. 08/31/15   [provider]  zolpidem (AMBIEN) 5 MG tablet Take 1 tablet by mouth at bedtime as needed for sleep.  06/16/15   [provider]    Allergies    Patient has no known allergies.  Review of Systems   Review of Systems  Unable to perform ROS: Mental status change  Constitutional:  Positive for fever.  Respiratory:  Positive for cough.   Genitourinary:  Positive for frequency and urgency.  Psychiatric/Behavioral:  Positive for confusion.  Physical Exam Updated Vital Signs BP (!) 202/98   Pulse (!) 110   Temp (!) 101.5 F (38.6 C) (Oral)   Resp (!) 22   SpO2 99%   Physical Exam Vitals and nursing note reviewed.  Constitutional:      General: He is not in acute distress. HENT:     Head: Normocephalic and atraumatic.     Mouth/Throat:     Mouth: Mucous membranes are dry.  Eyes:     Extraocular Movements: Extraocular movements intact.     Pupils: Pupils are equal, round, and reactive to light.  Cardiovascular:     Rate and Rhythm: Regular rhythm. Tachycardia present.     Comments: 2+ symmetric DP/PT pulses.   Pulmonary:     Comments: Tachypneic. Scattered rhonchi at the bases. Does not appear to be in significant respiratory distress.  Abdominal:     General: There is no distension.     Palpations: Abdomen is soft.     Tenderness: There is no abdominal tenderness. There is no guarding or rebound.  Musculoskeletal:     Cervical back: Neck supple. No rigidity.     Comments: Very trace LE edema bilaterally.   Skin:    General: Skin is warm and dry.     Comments: Hot to the touch.   Neurological:     Mental Status: He is alert.     Comments: Alert. No facial droop. Sensation grossly intact x 4. 5/5 symmetric grip strength & strength with plantar/dorsiflexion bilaterally. Oriented to person & place, disoriented to time- states it is January 2004.   Psychiatric:        Behavior: Behavior normal.    ED Results / Procedures / Treatments   Labs (all labs ordered are listed, but only abnormal results are displayed) Labs Reviewed  CULTURE, BLOOD (ROUTINE X 2)  CULTURE, BLOOD (ROUTINE X 2)  RESP PANEL BY RT-PCR (FLU A&B, COVID) ARPGX2  COMPREHENSIVE METABOLIC PANEL  CBC WITH DIFFERENTIAL/PLATELET  URINALYSIS, COMPLETE (UACMP) WITH MICROSCOPIC  LACTIC ACID, PLASMA  RAPID URINE DRUG SCREEN, HOSP PERFORMED  ETHANOL  PROTIME-INR  APTT  URINALYSIS, ROUTINE W REFLEX MICROSCOPIC  CBG MONITORING, ED    EKG None  Radiology CT HEAD WO CONTRAST  Result Date: 03/01/2021 CLINICAL DATA:  Mental status change of unknown cause. High fever today and confusion. EXAM: CT HEAD WITHOUT CONTRAST TECHNIQUE: Contiguous axial images were obtained from the base of the skull through the vertex without intravenous contrast. COMPARISON:  CT head 06/10/2020.  MRI brain 06/10/2020. FINDINGS: Brain: No evidence of acute infarction, hemorrhage, hydrocephalus, extra-axial collection or mass lesion/mass effect. Mild cerebral atrophy. Vascular: No hyperdense vessel or unexpected calcification. Skull: Normal. Negative for  fracture or focal lesion. Sinuses/Orbits: No acute finding. Other: None. IMPRESSION: No acute intracranial abnormalities. Electronically Signed   By: Lucienne Capers M.D.   On: 03/01/2021 22:54   DG Chest Portable 1 View  Result Date: 03/01/2021 CLINICAL DATA:  Sepsis. EXAM: PORTABLE CHEST 1 VIEW COMPARISON:  None. FINDINGS: Mild cardiomegaly with mild vascular congestion. No focal consolidation, pleural effusion or pneumothorax. Degenerative changes of the spine. No acute osseous pathology. IMPRESSION: Mild cardiomegaly with mild vascular congestion. No focal consolidation. Electronically Signed   By: Anner Crete M.D.   On: 03/01/2021 23:21    Procedures .Critical Care Performed by: Amaryllis Dyke, PA-C Authorized by: Amaryllis Dyke, PA-C    CRITICAL CARE Performed by: Kennith Maes   Total critical care time: 35 minutes  Critical care  time was exclusive of separately billable procedures and treating other patients.  Critical care was necessary to treat or prevent imminent or life-threatening deterioration.  Critical care was time spent personally by me on the following activities: development of treatment plan with patient and/or surrogate as well as nursing, discussions with consultants, evaluation of patient's response to treatment, examination of patient, obtaining history from patient or surrogate, ordering and performing treatments and interventions, ordering and review of laboratory studies, ordering and review of radiographic studies, pulse oximetry and re-evaluation of patient's condition.   Medications Ordered in ED Medications  lactated ringers infusion (has no administration in time range)  lactated ringers bolus 1,000 mL (has no administration in time range)    And  lactated ringers bolus 1,000 mL (has no administration in time range)    And  lactated ringers bolus 1,000 mL (has no administration in time range)  ceFEPIme (MAXIPIME) 2 g in  sodium chloride 0.9 % 100 mL IVPB (has no administration in time range)  metroNIDAZOLE (FLAGYL) IVPB 500 mg (has no administration in time range)  vancomycin (VANCOCIN) IVPB 1000 mg/200 mL premix (has no administration in time range)  lidocaine-EPINEPHrine (XYLOCAINE W/EPI) 2 %-1:200000 (PF) injection 10 mL (has no administration in time range)  acetaminophen (TYLENOL) tablet 1,000 mg (1,000 mg Oral Given 03/01/21 2225)    ED Course  I have reviewed the triage vital signs and the nursing notes.  Pertinent labs & imaging results that were available during my care of the patient were reviewed by me and considered in my medical decision making (see chart for details).    MDM Rules/Calculators/A&P                           Patient presents to the ED with complaints of fever & confusion today. Febrile, tachycardic, tachypneic & hypertensive on arrival. Code sepsis activated by provider in triage- abx, fluids & antipyretics ordered.   Additional history obtained:  Additional history obtained from patient's daughter, chart review & nursing note review.   Imaging Studies ordered:  CXR & CT head ordered, I independently reviewed, formal radiology impression shows:  CXR: Mild cardiomegaly with mild vascular congestion. No focal consolidation CT head: No acute intracranial abnormalities.   23:26: CXR with mild cardiomegaly and vascular congestion, initiallly 30 cc/kg bolus ordered, will discontinue 2nd & 3rd L of LR, 1st L administered, patient hypertensive and lactic acid pending.   Lab Tests:  I reviewed and interpreted labs, which included:  CBC: Leukocytosis.  CMP: acute on chronic kidney injury.  APTT/PT/INR: WNL Lactic acid: WNL Ethanol: WNL COVID; Positive.   ED Course:  Patient positive for COVID-19, has acute on chronic kidney injury, urinalysis is still pending.  Given his confusion with febrile illness and acute on chronic kidney injury will discuss with hospital service for  admission.  01:55: CONSULT: Discussed with hospitalist Dr. Marlowe Sax- accepts admission.   Findings and plan of care discussed with supervising physician Dr. Leonette Monarch who is in agreement.   Portions of this note were generated with Lobbyist. Dictation errors may occur despite best attempts at proofreading.  Final Clinical Impression(s) / ED Diagnoses Final diagnoses:  AKI (acute kidney injury) ([place])  COVID-19  Febrile illness    Rx / DC Orders ED Discharge Orders     None        Amaryllis Dyke, PA-C 03/02/21 0245    Fatima Blank, MD 03/05/21 2210

## 2021-03-01 NOTE — ED Triage Notes (Signed)
Pt here from home w/ daughter, per daughter pt has sudden AMS starting today, pt speaks arabic but daughter cannot understand what he's saying, he has had difficulty walking, daughter reports weakness and shaking.

## 2021-03-01 NOTE — ED Provider Notes (Signed)
Emergency Medicine Provider Triage Evaluation Note  Gabriel Fowler , a 65 y.o. male  was evaluated in triage.  Pt complains of confusion and fever.  Onset today.  Gradually worsening.  Review of Systems  Positive: Fever, confusion Negative: Vomiting, diarrhea  Physical Exam  There were no vitals taken for this visit. Gen:   Awake, no distress   Resp:  Normal effort  MSK:   Moves extremities without difficulty  Other:  Alert to person, place, but not time  Medical Decision Making  Medically screening exam initiated at 10:17 PM.  Appropriate orders placed.  Gabriel Fowler was informed that the remainder of the evaluation will be completed by another provider, this initial triage assessment does not replace that evaluation, and the importance of remaining in the ED until their evaluation is complete.  AMS   Montine Circle, PA-C 03/01/21 2218    Elnora Morrison, MD 03/01/21 2352

## 2021-03-02 DIAGNOSIS — U071 COVID-19: Secondary | ICD-10-CM | POA: Diagnosis present

## 2021-03-02 DIAGNOSIS — E7800 Pure hypercholesterolemia, unspecified: Secondary | ICD-10-CM | POA: Diagnosis present

## 2021-03-02 DIAGNOSIS — I5032 Chronic diastolic (congestive) heart failure: Secondary | ICD-10-CM | POA: Diagnosis present

## 2021-03-02 DIAGNOSIS — A4189 Other specified sepsis: Secondary | ICD-10-CM | POA: Diagnosis present

## 2021-03-02 DIAGNOSIS — Z28310 Unvaccinated for covid-19: Secondary | ICD-10-CM | POA: Diagnosis not present

## 2021-03-02 DIAGNOSIS — J1282 Pneumonia due to coronavirus disease 2019: Secondary | ICD-10-CM | POA: Diagnosis present

## 2021-03-02 DIAGNOSIS — G629 Polyneuropathy, unspecified: Secondary | ICD-10-CM | POA: Diagnosis present

## 2021-03-02 DIAGNOSIS — Z8673 Personal history of transient ischemic attack (TIA), and cerebral infarction without residual deficits: Secondary | ICD-10-CM | POA: Diagnosis not present

## 2021-03-02 DIAGNOSIS — R32 Unspecified urinary incontinence: Secondary | ICD-10-CM | POA: Diagnosis present

## 2021-03-02 DIAGNOSIS — Z7984 Long term (current) use of oral hypoglycemic drugs: Secondary | ICD-10-CM | POA: Diagnosis not present

## 2021-03-02 DIAGNOSIS — E1122 Type 2 diabetes mellitus with diabetic chronic kidney disease: Secondary | ICD-10-CM | POA: Diagnosis present

## 2021-03-02 DIAGNOSIS — N1832 Chronic kidney disease, stage 3b: Secondary | ICD-10-CM | POA: Diagnosis present

## 2021-03-02 DIAGNOSIS — I130 Hypertensive heart and chronic kidney disease with heart failure and stage 1 through stage 4 chronic kidney disease, or unspecified chronic kidney disease: Secondary | ICD-10-CM | POA: Diagnosis present

## 2021-03-02 DIAGNOSIS — Z79899 Other long term (current) drug therapy: Secondary | ICD-10-CM | POA: Diagnosis not present

## 2021-03-02 DIAGNOSIS — N1831 Chronic kidney disease, stage 3a: Secondary | ICD-10-CM | POA: Diagnosis not present

## 2021-03-02 DIAGNOSIS — E1165 Type 2 diabetes mellitus with hyperglycemia: Secondary | ICD-10-CM | POA: Diagnosis present

## 2021-03-02 DIAGNOSIS — N179 Acute kidney failure, unspecified: Secondary | ICD-10-CM | POA: Diagnosis present

## 2021-03-02 DIAGNOSIS — J9601 Acute respiratory failure with hypoxia: Secondary | ICD-10-CM | POA: Diagnosis present

## 2021-03-02 DIAGNOSIS — Z7902 Long term (current) use of antithrombotics/antiplatelets: Secondary | ICD-10-CM | POA: Diagnosis not present

## 2021-03-02 LAB — CBG MONITORING, ED
Glucose-Capillary: 127 mg/dL — ABNORMAL HIGH (ref 70–99)
Glucose-Capillary: 183 mg/dL — ABNORMAL HIGH (ref 70–99)
Glucose-Capillary: 151 mg/dL — ABNORMAL HIGH (ref 70–99)

## 2021-03-02 LAB — URINALYSIS, COMPLETE (UACMP) WITH MICROSCOPIC
Bacteria, UA: NONE SEEN
Bilirubin Urine: NEGATIVE
Glucose, UA: 250 mg/dL — AB
Ketones, ur: NEGATIVE mg/dL
Leukocytes,Ua: NEGATIVE
Nitrite: NEGATIVE
Protein, ur: >300 mg/dL — AB
Specific Gravity, Urine: 1.015 (ref 1.005–1.030)
pH: 7 (ref 5.0–8.0)

## 2021-03-02 LAB — RESP PANEL BY RT-PCR (FLU A&B, COVID) ARPGX2
Influenza A by PCR: NEGATIVE
Influenza B by PCR: NEGATIVE
SARS Coronavirus 2 by RT PCR: POSITIVE — AB

## 2021-03-02 LAB — FERRITIN: Ferritin: 164 ng/mL (ref 24–336)

## 2021-03-02 LAB — HEMOGLOBIN A1C
Hgb A1c MFr Bld: 6.3 % — ABNORMAL HIGH (ref 4.8–5.6)
Mean Plasma Glucose: 134.11 mg/dL

## 2021-03-02 LAB — RAPID URINE DRUG SCREEN, HOSP PERFORMED
Amphetamines: NOT DETECTED
Barbiturates: NOT DETECTED
Benzodiazepines: NOT DETECTED
Cocaine: NOT DETECTED
Opiates: NOT DETECTED
Tetrahydrocannabinol: NOT DETECTED

## 2021-03-02 LAB — PROCALCITONIN: Procalcitonin: 5.34 ng/mL

## 2021-03-02 LAB — D-DIMER, QUANTITATIVE: D-Dimer, Quant: 1.94 ug/mL-FEU — ABNORMAL HIGH (ref 0.00–0.50)

## 2021-03-02 LAB — HIV ANTIBODY (ROUTINE TESTING W REFLEX): HIV Screen 4th Generation wRfx: NONREACTIVE

## 2021-03-02 LAB — FIBRINOGEN: Fibrinogen: 463 mg/dL (ref 210–475)

## 2021-03-02 LAB — LACTATE DEHYDROGENASE: LDH: 182 U/L (ref 98–192)

## 2021-03-02 LAB — C-REACTIVE PROTEIN: CRP: 10.2 mg/dL — ABNORMAL HIGH (ref <1.0)

## 2021-03-02 MED ORDER — GABAPENTIN 100 MG PO CAPS
100.0000 mg | ORAL_CAPSULE | Freq: Three times a day (TID) | ORAL | Status: DC
  Administered 2021-03-02 – 2021-03-05 (×10): 100 mg via ORAL
  Filled 2021-03-02 (×10): qty 1

## 2021-03-02 MED ORDER — METHYLPREDNISOLONE SODIUM SUCC 40 MG IJ SOLR
40.0000 mg | Freq: Two times a day (BID) | INTRAMUSCULAR | Status: DC
  Administered 2021-03-02 – 2021-03-03 (×3): 40 mg via INTRAVENOUS
  Filled 2021-03-02 (×3): qty 1

## 2021-03-02 MED ORDER — HYDRALAZINE HCL 20 MG/ML IJ SOLN
5.0000 mg | Freq: Once | INTRAMUSCULAR | Status: AC
  Administered 2021-03-02: 5 mg via INTRAVENOUS
  Filled 2021-03-02: qty 1

## 2021-03-02 MED ORDER — PHENOL 1.4 % MT LIQD
1.0000 | OROMUCOSAL | Status: DC | PRN
  Administered 2021-03-03: 1 via OROMUCOSAL
  Filled 2021-03-02: qty 177

## 2021-03-02 MED ORDER — ONDANSETRON HCL 4 MG/2ML IJ SOLN: 4.0000 mg | Freq: Four times a day (QID) | INTRAMUSCULAR | Status: DC | PRN

## 2021-03-02 MED ORDER — ALBUTEROL SULFATE HFA 108 (90 BASE) MCG/ACT IN AERS
2.0000 | INHALATION_SPRAY | RESPIRATORY_TRACT | Status: DC | PRN
  Filled 2021-03-02: qty 6.7

## 2021-03-02 MED ORDER — CLOPIDOGREL BISULFATE 75 MG PO TABS
75.0000 mg | ORAL_TABLET | Freq: Every day | ORAL | Status: DC
  Administered 2021-03-02 – 2021-03-05 (×4): 75 mg via ORAL
  Filled 2021-03-02 (×4): qty 1

## 2021-03-02 MED ORDER — HYDROCOD POLST-CPM POLST ER 10-8 MG/5ML PO SUER
5.0000 mL | Freq: Two times a day (BID) | ORAL | Status: DC | PRN
Start: 2021-03-02 — End: 2021-03-05
  Administered 2021-03-04: 5 mL via ORAL
  Filled 2021-03-02: qty 5

## 2021-03-02 MED ORDER — SODIUM CHLORIDE 0.9 % IV SOLN
200.0000 mg | Freq: Once | INTRAVENOUS | Status: AC
  Administered 2021-03-02: 200 mg via INTRAVENOUS
  Filled 2021-03-02: qty 40

## 2021-03-02 MED ORDER — SODIUM CHLORIDE 0.9% FLUSH
3.0000 mL | Freq: Two times a day (BID) | INTRAVENOUS | Status: DC
  Administered 2021-03-02 – 2021-03-04 (×4): 3 mL via INTRAVENOUS

## 2021-03-02 MED ORDER — BISACODYL 5 MG PO TBEC
5.0000 mg | DELAYED_RELEASE_TABLET | Freq: Every day | ORAL | Status: DC | PRN
  Administered 2021-03-05: 5 mg via ORAL
  Filled 2021-03-02: qty 1

## 2021-03-02 MED ORDER — AMLODIPINE BESYLATE 10 MG PO TABS
10.0000 mg | ORAL_TABLET | Freq: Every day | ORAL | Status: DC
  Administered 2021-03-02 – 2021-03-05 (×4): 10 mg via ORAL
  Filled 2021-03-02 (×3): qty 1
  Filled 2021-03-02: qty 2

## 2021-03-02 MED ORDER — SODIUM CHLORIDE 0.9 % IV SOLN
250.0000 mL | INTRAVENOUS | Status: DC | PRN
  Administered 2021-03-03: 250 mL via INTRAVENOUS

## 2021-03-02 MED ORDER — DOCUSATE SODIUM 100 MG PO CAPS
100.0000 mg | ORAL_CAPSULE | Freq: Two times a day (BID) | ORAL | Status: DC
  Administered 2021-03-02 – 2021-03-05 (×7): 100 mg via ORAL
  Filled 2021-03-02 (×7): qty 1

## 2021-03-02 MED ORDER — ENOXAPARIN SODIUM 30 MG/0.3ML IJ SOSY
30.0000 mg | PREFILLED_SYRINGE | Freq: Every day | INTRAMUSCULAR | Status: DC
  Administered 2021-03-02 – 2021-03-05 (×4): 30 mg via SUBCUTANEOUS
  Filled 2021-03-02 (×4): qty 0.3

## 2021-03-02 MED ORDER — GUAIFENESIN-DM 100-10 MG/5ML PO SYRP: 10.0000 mL | ORAL_SOLUTION | ORAL | Status: DC | PRN

## 2021-03-02 MED ORDER — POLYETHYLENE GLYCOL 3350 17 G PO PACK: 17.0000 g | PACK | Freq: Every day | ORAL | Status: DC | PRN

## 2021-03-02 MED ORDER — ACETAMINOPHEN 325 MG PO TABS: 650.0000 mg | ORAL_TABLET | Freq: Four times a day (QID) | ORAL | Status: DC | PRN

## 2021-03-02 MED ORDER — OXYCODONE HCL 5 MG PO TABS: 5.0000 mg | ORAL_TABLET | ORAL | Status: DC | PRN

## 2021-03-02 MED ORDER — HYDRALAZINE HCL 20 MG/ML IJ SOLN: 5.0000 mg | INTRAMUSCULAR | Status: DC | PRN

## 2021-03-02 MED ORDER — INSULIN ASPART 100 UNIT/ML IJ SOLN
0.0000 [IU] | Freq: Three times a day (TID) | INTRAMUSCULAR | Status: DC
  Administered 2021-03-02: 2 [IU] via SUBCUTANEOUS
  Administered 2021-03-02: 3 [IU] via SUBCUTANEOUS
  Administered 2021-03-03: 5 [IU] via SUBCUTANEOUS

## 2021-03-02 MED ORDER — SODIUM CHLORIDE 0.9 % IV SOLN
100.0000 mg | Freq: Every day | INTRAVENOUS | Status: DC
  Administered 2021-03-03 – 2021-03-05 (×3): 100 mg via INTRAVENOUS
  Filled 2021-03-02 (×4): qty 20

## 2021-03-02 MED ORDER — SODIUM CHLORIDE 0.9% FLUSH: 3.0000 mL | INTRAVENOUS | Status: DC | PRN

## 2021-03-02 MED ORDER — ONDANSETRON HCL 4 MG PO TABS: 4.0000 mg | ORAL_TABLET | Freq: Four times a day (QID) | ORAL | Status: DC | PRN

## 2021-03-02 MED ORDER — FLEET ENEMA 7-19 GM/118ML RE ENEM: 1.0000 | ENEMA | Freq: Once | RECTAL | Status: DC | PRN

## 2021-03-02 MED ORDER — INSULIN ASPART 100 UNIT/ML IJ SOLN: 0.0000 [IU] | Freq: Every day | INTRAMUSCULAR | Status: DC

## 2021-03-02 MED ORDER — PREDNISONE 20 MG PO TABS: 50.0000 mg | ORAL_TABLET | Freq: Every day | ORAL | Status: DC

## 2021-03-02 MED ORDER — ISOSORBIDE MONONITRATE ER 30 MG PO TB24
30.0000 mg | ORAL_TABLET | Freq: Every day | ORAL | Status: DC
  Administered 2021-03-02 – 2021-03-05 (×4): 30 mg via ORAL
  Filled 2021-03-02 (×4): qty 1

## 2021-03-02 MED ORDER — ACETAMINOPHEN 325 MG PO TABS
650.0000 mg | ORAL_TABLET | Freq: Four times a day (QID) | ORAL | Status: DC | PRN
  Administered 2021-03-02: 650 mg via ORAL
  Filled 2021-03-02: qty 2

## 2021-03-02 MED ORDER — ASCORBIC ACID 500 MG PO TABS
500.0000 mg | ORAL_TABLET | Freq: Every day | ORAL | Status: DC
  Administered 2021-03-02 – 2021-03-05 (×4): 500 mg via ORAL
  Filled 2021-03-02 (×4): qty 1

## 2021-03-02 MED ORDER — ZINC SULFATE 220 (50 ZN) MG PO CAPS
220.0000 mg | ORAL_CAPSULE | Freq: Every day | ORAL | Status: DC
  Administered 2021-03-02 – 2021-03-05 (×4): 220 mg via ORAL
  Filled 2021-03-02 (×4): qty 1

## 2021-03-02 MED ORDER — MENTHOL 3 MG MT LOZG
1.0000 | LOZENGE | OROMUCOSAL | Status: DC | PRN
  Administered 2021-03-02: 3 mg via ORAL
  Filled 2021-03-02 (×2): qty 9

## 2021-03-02 NOTE — ED Notes (Signed)
Rn updated pts daughter via phone

## 2021-03-02 NOTE — ED Notes (Signed)
Family updated as to patient's status.

## 2021-03-02 NOTE — ED Notes (Signed)
Pt incontinent of urine in bed.  Used translator to explain placement of male [place].  Pt placed in hospital bed.  Requesting tylenol.  Pt appears more alert.  Now alert to year but still thinks month is October.

## 2021-03-02 NOTE — ED Notes (Signed)
Pts daughter updated at bedside

## 2021-03-02 NOTE — ED Notes (Signed)
RN notified MD of pts BP of 180/120 (136). Meds ordered

## 2021-03-02 NOTE — ED Notes (Signed)
Patient is resting comfortably. 

## 2021-03-02 NOTE — ED Notes (Addendum)
Checked patient cbg it was 89 notified RN of blood sugar patient is resting with call bell in reach

## 2021-03-02 NOTE — ED Notes (Signed)
Notified provider of pt's COVID+ status.

## 2021-03-02 NOTE — H&P (Signed)
History and Physical    Gabriel Fowler [id number] DOB: [birth year] DOA: 03/01/2021  PCP: Elwyn Reach, MD Consultants:  None Patient coming from:  Home - lives with wife and children; NOK: Daughter, Gabriel Fowler, [phone]  Chief Complaint: Fever, confusion  HPI: Gabriel Fowler is a 65 y.o. male with medical history significant of DM; HTN; CVA; chronic diastolic dysfunction; and HLD presenting with fever, confusion.  He reports left-sided chest pain for several days with cough, hoarseness.  He is unvaccinated against COVID and appears to not believe that it is a thing - "Well, why am I hoarse? How did I get COVID?"  Also with confusion, n/v/d.  He had urinary incontinence in the ER.  Per chart note, "his daughter is sick with somewhat similar sxs" although the patient specifically denied sick contacts via tele-interpreter.    ED Course: Carryover, per Dr. Marlowe Sax:  65 year old with history of stroke, hypertension, diabetes, CHF, CKD presented with fever, confusion, and urinary frequency.  Febrile and tachycardic on arrival, code sepsis initiated and patient was given broad-spectrum antibiotics and IV fluids.  Chest x-ray came back showing mild vascular congestion and fluids discontinued.  Creatinine up to 3.2, was 2.1 previously.  Head CT negative and neuro exam nonfocal.  WBC 13.5.  UA pending to rule out UTI.  COVID test came back positive but vaccination status unknown.  Patient satting in the 90s on room air.  Review of Systems: As per HPI; otherwise review of systems reviewed and negative.   Ambulatory Status:  Ambulates without assistance  COVID Vaccine Status:  None  Past Medical History:  Diagnosis Date   Diabetes mellitus without complication ([place])    Hypercholesteremia    Hypertension     Past Surgical History:  Procedure Laterality Date   EXTRA SKIN REMOVED FROM BACK OF NECK      Social History   Socioeconomic History   Marital status: Married     Spouse name: Not on file   Number of children: 3   Years of education: Not on file   Highest education level: Not on file  Occupational History   Occupation: Disabled  Tobacco Use   Smoking status: Never   Smokeless tobacco: Never  Substance and Sexual Activity   Alcohol use: No   Drug use: No   Sexual activity: Yes  Other Topics Concern   Not on file  Social History Narrative   Married.  Lives with wife.   Social Determinants of Health   Financial Resource Strain: Not on file  Food Insecurity: Not on file  Transportation Needs: Not on file  Physical Activity: Not on file  Stress: Not on file  Social Connections: Not on file  Intimate Partner Violence: Not on file    No Known Allergies  Family History  Problem Relation Age of Onset   Stroke Neg Hx    Heart disease Neg Hx    Cancer Neg Hx     Prior to Admission medications   Medication Sig Start Date End Date Taking? Authorizing Provider  amLODipine (NORVASC) 10 MG tablet Take 10 mg by mouth daily.   Yes [provider]  Cholecalciferol (VITAMIN D3) 50 MCG (2000 UT) TABS Take 2,000 Units by mouth daily.   Yes [provider]  clopidogrel (PLAVIX) 75 MG tablet Take 1 tablet (75 mg total) by mouth daily. 09/04/15  Yes Barton Dubois, MD  gabapentin (NEURONTIN) 100 MG capsule Take 100 mg by mouth 3 (three) times daily. 02/15/21  Yes  [provider]  glipiZIDE (GLUCOTROL) 5 MG tablet Take 5 mg by mouth 2 (two) times daily. 02/23/21  Yes [provider]  isosorbide mononitrate (IMDUR) 30 MG 24 hr tablet Take 30 mg by mouth daily. 02/06/21  Yes [provider]  meloxicam (MOBIC) 7.5 MG tablet Take 7.5 mg by mouth daily.   Yes [provider]  zinc gluconate 50 MG tablet Take 50 mg by mouth daily.   Yes [provider]  pravastatin (PRAVACHOL) 40 MG tablet Take 1 tablet (40 mg total) by mouth daily. Patient not taking: No sig reported 09/04/15   Barton Dubois, MD     Physical Exam: Vitals:   03/02/21 1200 03/02/21 1215 03/02/21 1230 03/02/21 1245  BP: (!) 182/42  (!) 169/62 (!) 183/95  Pulse: 86  82 89  Resp:  13 17 (!) 22  Temp:      TempSrc:      SpO2: 95%  97% 97%  Weight:      Height:         General:  Appears calm and comfortable and is in NAD, on RA at the time of my evaluation Eyes:   EOMI, normal lids, iris ENT:  grossly normal hearing, lips & tongue, mmm; +hoarseness Neck:  no LAD, masses or thyromegaly Cardiovascular:  RRR, no m/r/g. No LE edema.  Respiratory:   Scattered rhonchi.  Normal respiratory effort. Abdomen:  soft, NT, ND Skin:  no rash or induration seen on limited exam Musculoskeletal:  grossly normal tone BUE/BLE, good ROM, no bony abnormality Lower extremity:  No LE edema.  Limited foot exam with no ulcerations.  2+ distal pulses. Psychiatric:  blunted mood and affect, speech fluent and appropriate in Arabic Neurologic:  CN 2-12 grossly intact, moves all extremities in coordinated fashion    Radiological Exams on Admission: Independently reviewed - see discussion in A/P where applicable  CT HEAD WO CONTRAST  Result Date: 03/01/2021 CLINICAL DATA:  Mental status change of unknown cause. High fever today and confusion. EXAM: CT HEAD WITHOUT CONTRAST TECHNIQUE: Contiguous axial images were obtained from the base of the skull through the vertex without intravenous contrast. COMPARISON:  CT head 06/10/2020.  MRI brain 06/10/2020. FINDINGS: Brain: No evidence of acute infarction, hemorrhage, hydrocephalus, extra-axial collection or mass lesion/mass effect. Mild cerebral atrophy. Vascular: No hyperdense vessel or unexpected calcification. Skull: Normal. Negative for fracture or focal lesion. Sinuses/Orbits: No acute finding. Other: None. IMPRESSION: No acute intracranial abnormalities. Electronically Signed   By: Lucienne Capers M.D.   On: 03/01/2021 22:54   DG Chest Portable 1 View  Result Date: 03/01/2021 CLINICAL  DATA:  Sepsis. EXAM: PORTABLE CHEST 1 VIEW COMPARISON:  None. FINDINGS: Mild cardiomegaly with mild vascular congestion. No focal consolidation, pleural effusion or pneumothorax. Degenerative changes of the spine. No acute osseous pathology. IMPRESSION: Mild cardiomegaly with mild vascular congestion. No focal consolidation. Electronically Signed   By: Anner Crete M.D.   On: 03/01/2021 23:21    EKG: Independently reviewed.  NSR with rate 93; no evidence of acute ischemia   Labs on Admission: I have personally reviewed the available labs and imaging studies at the time of the admission.  Pertinent labs:   Glucose 158 BUN 40/Creatinine 3.29/GFR 20; 41/2.24/32 in 05/2020 Lactate 1.5 WBC 13.5 INR 1.2 COVID POSITIVE UA: 250 glucose, moderate Hgb, >300 protein UDS negative LDH 182   Assessment/Plan Principal Problem:   COVID-19 Active Problems:   Diabetes mellitus without complication (HCC)   Hypertension  Hypercholesteremia   Chronic renal failure, stage 3 (moderate) (HCC)   Chronic diastolic CHF (congestive heart failure) (HCC)   Acute respiratory failure with hypoxia due to COVID-19 PNA -Patient with presenting with SOB, cough, and hoarseness as well as fatigue -Mild GI symptoms -He does not have a current O2 requirement but has been as low as 90% -COVID POSITIVE -The patient has comorbidities which may increase the risk for ARDS/MODS including: age, HTN, DM, CKD, CHF -Pertinent labs concerning for COVID include increased BUN/Creatinine; remaining COVID labs are pending -CXR with multifocal opacities which may be c/w COVID vs. Multifocal PNA -Will not treat with broad-spectrum antibiotics if procalcitonin <0.5 -Will admit for further evaluation, close monitoring, and treatment -Monitor on telemetry x at least 24 hours -At this time, will attempt to avoid use of aerosolized medications and use HFAs instead -Will check daily labs including BMP with Mag, Phos; LFTs; CBC with  differential; CRP; ferritin; fibrinogen; D-dimer -Will order steroids and Remdesivir (pharmacy consult) given +COVID test, +CXR, and hypoxia <94% on room air -If the patient shows clinical deterioration, consider transfer to ICU with PCCM consultation -PT/OT consults -Encourage mobilization/ambulation as much as possible -Patient was seen wearing full PPE including: gown, gloves, head cover, N95, and face shield; donning and doffing was in compliance with current standards.  DM -Will check A1c -May utilize continuous glucose monitoring -hold Glucotrol -Cover with moderate-scale SSI -Continue Neurontin  HTN -Continue Norvasc  HLD -Continue Pravachol  AKI on Stage 3b CKD -May be his new baseline or may be AKI component, but slightly worse than usual   -Will monitor  H/o CVA -Continue Plavix, Imdur  Chronic diastolic CHF -Grade 1 diastolic dysfunction on echo in 2017 -Appears compensated from this standpoint currently     Level of care: Telemetry Medical DVT prophylaxis:  Lovenox  Code Status:  Full - confirmed with patient Family Communication: None present; he declined to have me contact his family at the time of admission Disposition Plan:  The patient is from: home  Anticipated d/c is to: home without [facility] services once his respiratory issues have been resolved.  He may require home O2 at the time of discharge.  Anticipated d/c date will depend on clinical response to treatment, likely between 3 days (with completion of outpatient Remdesivir treatment) and 5 days  Patient is currently: acutely ill Consults called: PT/OT  Admission status: Admit - It is my clinical opinion that admission to [place] is reasonable and necessary because of the expectation that this patient will require hospital care that crosses at least 2 midnights to treat this condition based on the medical complexity of the problems presented.  Given the aforementioned information, the predictability of an  adverse outcome is felt to be significant.      Karmen Bongo MD [facility]   How to contact the [facility] Attending or Consulting provider [place] or covering provider during after hours [place], for this patient?  Check the care team in [facility] and look for a) attending/consulting [facility] provider listed and b) the [facility] team listed Log into [email or web address] and use [place] [facility]'s universal password to access. If you do not have the password, please contact the hospital operator. Locate the [facility] provider you are looking for under [facility] and page to a number that you can be directly reached. If you still have difficulty reaching the provider, please page the [facility] (Director on Call) for the Hospitalists listed on amion for assistance.   03/02/2021, 1:17  PM

## 2021-03-03 DIAGNOSIS — I5032 Chronic diastolic (congestive) heart failure: Secondary | ICD-10-CM

## 2021-03-03 DIAGNOSIS — N1831 Chronic kidney disease, stage 3a: Secondary | ICD-10-CM

## 2021-03-03 DIAGNOSIS — N179 Acute kidney failure, unspecified: Secondary | ICD-10-CM

## 2021-03-03 DIAGNOSIS — I10 Essential (primary) hypertension: Secondary | ICD-10-CM

## 2021-03-03 LAB — CBC WITH DIFFERENTIAL/PLATELET
Abs Immature Granulocytes: 0.09 10*3/uL — ABNORMAL HIGH (ref 0.00–0.07)
Basophils Absolute: 0 10*3/uL (ref 0.0–0.1)
Basophils Relative: 0 %
Eosinophils Absolute: 0 10*3/uL (ref 0.0–0.5)
Eosinophils Relative: 0 %
HCT: 43.2 % (ref 39.0–52.0)
Hemoglobin: 14.2 g/dL (ref 13.0–17.0)
Immature Granulocytes: 1 %
Lymphocytes Relative: 5 %
Lymphs Abs: 0.8 10*3/uL (ref 0.7–4.0)
MCH: 27.7 pg (ref 26.0–34.0)
MCHC: 32.9 g/dL (ref 30.0–36.0)
MCV: 84.2 fL (ref 80.0–100.0)
Monocytes Absolute: 0.4 10*3/uL (ref 0.1–1.0)
Monocytes Relative: 3 %
Neutro Abs: 14.3 10*3/uL — ABNORMAL HIGH (ref 1.7–7.7)
Neutrophils Relative %: 91 %
Platelets: 133 10*3/uL — ABNORMAL LOW (ref 150–400)
RBC: 5.13 MIL/uL (ref 4.22–5.81)
RDW: 13.2 % (ref 11.5–15.5)
WBC: 15.7 10*3/uL — ABNORMAL HIGH (ref 4.0–10.5)
nRBC: 0 % (ref 0.0–0.2)

## 2021-03-03 LAB — COMPREHENSIVE METABOLIC PANEL
ALT: 21 U/L (ref 0–44)
AST: 24 U/L (ref 15–41)
Albumin: 3.2 g/dL — ABNORMAL LOW (ref 3.5–5.0)
Alkaline Phosphatase: 102 U/L (ref 38–126)
Anion gap: 15 (ref 5–15)
BUN: 61 mg/dL — ABNORMAL HIGH (ref 8–23)
CO2: 18 mmol/L — ABNORMAL LOW (ref 22–32)
Calcium: 8.9 mg/dL (ref 8.9–10.3)
Chloride: 104 mmol/L (ref 98–111)
Creatinine, Ser: 3.43 mg/dL — ABNORMAL HIGH (ref 0.61–1.24)
GFR, Estimated: 19 mL/min — ABNORMAL LOW (ref >60)
Glucose, Bld: 194 mg/dL — ABNORMAL HIGH (ref 70–99)
Potassium: 4.8 mmol/L (ref 3.5–5.1)
Sodium: 137 mmol/L (ref 135–145)
Total Bilirubin: 0.9 mg/dL (ref 0.3–1.2)
Total Protein: 6.6 g/dL (ref 6.5–8.1)

## 2021-03-03 LAB — D-DIMER, QUANTITATIVE: D-Dimer, Quant: 1.53 ug/mL-FEU — ABNORMAL HIGH (ref 0.00–0.50)

## 2021-03-03 LAB — GLUCOSE, CAPILLARY
Glucose-Capillary: 224 mg/dL — ABNORMAL HIGH (ref 70–99)
Glucose-Capillary: 247 mg/dL — ABNORMAL HIGH (ref 70–99)
Glucose-Capillary: 362 mg/dL — ABNORMAL HIGH (ref 70–99)
Glucose-Capillary: 219 mg/dL — ABNORMAL HIGH (ref 70–99)

## 2021-03-03 LAB — PHOSPHORUS: Phosphorus: 5.2 mg/dL — ABNORMAL HIGH (ref 2.5–4.6)

## 2021-03-03 LAB — FERRITIN: Ferritin: 241 ng/mL (ref 24–336)

## 2021-03-03 LAB — C-REACTIVE PROTEIN: CRP: 18.2 mg/dL — ABNORMAL HIGH (ref <1.0)

## 2021-03-03 LAB — MAGNESIUM: Magnesium: 2 mg/dL (ref 1.7–2.4)

## 2021-03-03 MED ORDER — SODIUM CHLORIDE 0.9 % IV SOLN
500.0000 mg | INTRAVENOUS | Status: DC
  Administered 2021-03-03 – 2021-03-04 (×2): 500 mg via INTRAVENOUS
  Filled 2021-03-03 (×3): qty 500

## 2021-03-03 MED ORDER — LIDOCAINE VISCOUS HCL 2 % MT SOLN
15.0000 mL | OROMUCOSAL | Status: DC | PRN
  Filled 2021-03-03: qty 15

## 2021-03-03 MED ORDER — PREDNISONE 10 MG PO TABS
10.0000 mg | ORAL_TABLET | Freq: Every day | ORAL | Status: DC
  Administered 2021-03-04: 10 mg via ORAL
  Filled 2021-03-03: qty 1

## 2021-03-03 MED ORDER — CEFTRIAXONE SODIUM 2 G IJ SOLR
2.0000 g | INTRAMUSCULAR | Status: DC
Start: 2021-03-03 — End: 2021-03-05
  Administered 2021-03-03 – 2021-03-04 (×2): 2 g via INTRAVENOUS
  Filled 2021-03-03 (×3): qty 20

## 2021-03-03 MED ORDER — SODIUM CHLORIDE 0.9 % IV SOLN: INTRAVENOUS | Status: AC

## 2021-03-03 MED ORDER — INSULIN ASPART 100 UNIT/ML IJ SOLN
0.0000 [IU] | Freq: Every day | INTRAMUSCULAR | Status: DC
  Administered 2021-03-03: 2 [IU] via SUBCUTANEOUS
  Administered 2021-03-04: 3 [IU] via SUBCUTANEOUS

## 2021-03-03 MED ORDER — POLYETHYLENE GLYCOL 3350 17 G PO PACK
17.0000 g | PACK | Freq: Every day | ORAL | Status: AC
  Administered 2021-03-04: 17 g via ORAL
  Filled 2021-03-03: qty 1

## 2021-03-03 MED ORDER — INSULIN ASPART 100 UNIT/ML IJ SOLN
3.0000 [IU] | Freq: Three times a day (TID) | INTRAMUSCULAR | Status: DC
  Administered 2021-03-03 – 2021-03-05 (×5): 3 [IU] via SUBCUTANEOUS

## 2021-03-03 MED ORDER — INSULIN ASPART 100 UNIT/ML IJ SOLN
0.0000 [IU] | Freq: Three times a day (TID) | INTRAMUSCULAR | Status: DC
  Administered 2021-03-03: 9 [IU] via SUBCUTANEOUS
  Administered 2021-03-04: 5 [IU] via SUBCUTANEOUS
  Administered 2021-03-04: 3 [IU] via SUBCUTANEOUS
  Administered 2021-03-04: 5 [IU] via SUBCUTANEOUS
  Administered 2021-03-05: 1 [IU] via SUBCUTANEOUS

## 2021-03-03 NOTE — Progress Notes (Signed)
OT EVALUATION   Clinical Impression   Pt. Was seen for skilled OT to assess for needs. Pt. Did well during OT session and did not need assist with ADLs or amb and transfer to commode in bathroom. Pt. Stood at sink without assist for grooming tasks. Pt. Ed to attempt to sit up an hour until he should request to go back to bed. Pt. Was ed on use of call bell and demo understanding. An tele interpreter was used. No further OT needed.         03/03/21 1238  OT Visit Information  Last OT Received On 03/03/21  Assistance Needed +1  History of Present Illness 65 y.o. male presents to ED with fever and confusion.  He has not been vaccinated tested and positive for COVID. Admitted for treatment 03/02/21 PMH: diabetes mellitus, essential hypertension, CVA chronic diastolic heart failure and hyperlipidemia  Precautions  Precautions None  Restrictions  Weight Bearing Restrictions No  Home Living  Family/patient expects to be discharged to: Private residence  Living Arrangements Spouse/significant other;Children  Available Help at Discharge Family  Type of [place] to enter  Entrance Stairs-Number of Steps 3  [place] One level  Bathroom Shower/Tub Tub/shower unit  [place] - single point  Prior Function  Level of Independence Independent with assistive device(s)  Comments uses cane for community ambulation  Risk analyst utilized  Pain Assessment  Pain Assessment No/denies pain  Cognition  Arousal/Alertness Awake/alert  Behavior During Therapy WFL for tasks assessed/performed  Overall Cognitive Status Within Functional Limits for tasks assessed  Upper Extremity Assessment  Upper Extremity Assessment Overall WFL for tasks assessed  Lower Extremity Assessment  Lower Extremity Assessment Defer to PT evaluation  ADL  Overall ADL's  At baseline  General ADL  Comments Pt. was able to amb to bathroom and preform toleting without assist. Pt. was able to stand up at sink for grooming without assist. Pt. was able to preform LE dressing tasks in chair without assit.  Vision- History  Baseline Vision/History 1 Wears glasses  Ability to See in Adequate Light 0 Adequate  Patient Visual Report No change from baseline  Vision- Assessment  Vision Assessment? No apparent visual deficits  Bed Mobility  Overal bed mobility Modified Independent  General bed mobility comments HoB elevated and use of rails to come to EoB  Transfers  Overall transfer level Modified independent  Equipment used Straight cane  Balance  Overall balance assessment Mild deficits observed, not formally tested  OT - End of Session  Equipment Utilized During Treatment Gait belt  Activity Tolerance Patient tolerated treatment well  Patient left in chair;with call bell/phone within reach;with chair alarm set;with family/visitor present  Nurse Communication  (ok therpay)  OT Assessment  OT Recommendation/Assessment Patient does not need any further OT services  AM-PAC OT "6 Clicks" Daily Activity Outcome Measure (Version 2)  Help from another person eating meals? 4  Help from another person taking care of personal grooming? 4  Help from another person toileting, which includes using toliet, bedpan, or urinal? 4  Help from another person bathing (including washing, rinsing, drying)? 4  Help from another person to put on and taking off regular upper body clothing? 4  Help from another person to put on and taking off regular lower body clothing? 4  6 Click Score 24  Progressive Mobility  What is the highest level of  mobility based on the progressive mobility assessment? Level 4 (Walks with assist in room) - Balance while marching in place and cannot step forward and back - Complete  Mobility Ambulated with assistance in room  OT Recommendation  Follow Up Recommendations No OT follow up   OT Equipment None recommended by OT  Acute Rehab OT Goals  Patient Stated Goal go home  OT Time Calculation  OT Start Time (ACUTE ONLY) 1057  OT Stop Time (ACUTE ONLY) 1129  OT Time Calculation (min) 32 min  OT General Charges  $OT Visit 1 Visit  OT Evaluation  $OT Eval Moderate Complexity 1 Mod  Written Expression  Dominant Hand Right  Reece Packer OT/L '

## 2021-03-03 NOTE — Progress Notes (Signed)
Unable to complete pt's admission documentation due to pt's inability to understand the interpretor's dialect.   Elaina Hoops, RN

## 2021-03-03 NOTE — Evaluation (Signed)
Physical Therapy Evaluation Patient Details Name: Gabriel Fowler MRN: [id number] DOB: [birth year] Today's Date: 03/03/2021  History of Present Illness  65 y.o. male presents to ED with fever and confusion.  He has not been vaccinated tested and positive for COVID. Admitted for treatment 03/02/21 PMH: diabetes mellitus, essential hypertension, CVA chronic diastolic heart failure and hyperlipidemia  Clinical Impression  PTA pt living with family in single story home with 3 steps to enter. Pt independent with community ambulation with use of SPC. Pt independent in self care. Pt limited in safe mobility due to increased WoB with ambulation, and generalized weakness. Pt is mod I for bed mobility, transfers and ambulation with SPC. Pt will not have any PT needs at discharge however PT will continue to see acutely to improve strength and endurance to PLOF.        Recommendations for follow up therapy are one component of a multi-disciplinary discharge planning process, led by the attending physician.  Recommendations may be updated based on patient status, additional functional criteria and insurance authorization.  Follow Up Recommendations No PT follow up;Supervision for mobility/OOB    Equipment Recommendations  None recommended by PT       Precautions / Restrictions Precautions Precautions: None Restrictions Weight Bearing Restrictions: No      Mobility  Bed Mobility Overal bed mobility: Modified Independent             General bed mobility comments: HoB elevated and use of rails to come to EoB    Transfers Overall transfer level: Modified independent Equipment used: Straight cane             General transfer comment: increased effort to power up, good self steady with [facility]  Ambulation/Gait Ambulation/Gait assistance: Supervision Gait Distance (Feet): 20 Feet (x2) Assistive device: Straight cane Gait Pattern/deviations: WFL(Within Functional Limits);Step-through  pattern;Wide base of support Gait velocity: slowed Gait velocity interpretation: 1.31 - 2.62 ft/sec, indicative of limited community ambulator General Gait Details: overall WFL, wide BoS, and slight shuffle         Balance Overall balance assessment: Mild deficits observed, not formally tested                                           Pertinent Vitals/Pain Pain Assessment: No/denies pain    Home Living Family/patient expects to be discharged to:: Private residence Living Arrangements: Spouse/significant other;Children Available Help at Discharge: Family Type of Home: House Home Access: Stairs to enter   Technical brewer of Steps: 3 Home Layout: One level Home Equipment: Cane - single point      Prior Function Level of Independence: Independent with assistive device(s)         Comments: uses cane for community ambulation     Hand Dominance   Dominant Hand: Right    Extremity/Trunk Assessment   Upper Extremity Assessment Upper Extremity Assessment: Defer to OT evaluation    Lower Extremity Assessment Lower Extremity Assessment: Overall WFL for tasks assessed       Communication   Communication: Interpreter utilized Samantha Crimes [id number])  Cognition Arousal/Alertness: Awake/alert Behavior During Therapy: WFL for tasks assessed/performed Overall Cognitive Status: Within Functional Limits for tasks assessed  General Comments General comments (skin integrity, edema, etc.): Pt reports being hot and is sweating profusely, BP 153/95 HR 84-117 bpm, SaO2 96-100% on RA        Assessment/Plan    PT Assessment Patient needs continued PT services  PT Problem List Decreased activity tolerance;Cardiopulmonary status limiting activity       PT Treatment Interventions Gait training;Stair training;DME instruction;Functional mobility training;Therapeutic activities;Therapeutic exercise;Balance  training;Cognitive remediation;Patient/family education    PT Goals (Current goals can be found in the Care Plan section)  Acute Rehab PT Goals Patient Stated Goal: go home PT Goal Formulation: With patient Time For Goal Achievement: 03/17/21 Potential to Achieve Goals: Good    Frequency Min 3X/week    AM-PAC PT "6 Clicks" Mobility  Outcome Measure Help needed turning from your back to your side while in a flat bed without using bedrails?: None Help needed moving from lying on your back to sitting on the side of a flat bed without using bedrails?: None Help needed moving to and from a bed to a chair (including a wheelchair)?: None Help needed standing up from a chair using your arms (e.g., wheelchair or bedside chair)?: None Help needed to walk in hospital room?: None Help needed climbing 3-5 steps with a railing? : A Little 6 Click Score: 23    End of Session   Activity Tolerance: Patient tolerated treatment well Patient left: Other (comment) (in bathroom with OT in room) Nurse Communication: Mobility status;Other (comment) (sweating and feeling hot) PT Visit Diagnosis: Difficulty in walking, not elsewhere classified (R26.2)    Time: 1025-1105 PT Time Calculation (min) (ACUTE ONLY): 40 min   Charges:   PT Evaluation $PT Eval Moderate Complexity: 1 Mod PT Treatments $Therapeutic Exercise: 8-22 mins $Self Care/Home Management: 8-22        Cannie Muckle B. Migdalia Dk PT, DPT Acute Rehabilitation Services Pager [phone] Office [phone]   [place] 03/03/2021, 12:32 PM

## 2021-03-03 NOTE — Progress Notes (Addendum)
[facility] PROGRESS NOTE    Progress Note  Gabriel Fowler  [id number] DOB: 07-10-1955 DOA: 03/01/2021 PCP: Elwyn Reach, MD     Brief Narrative:   Gabriel Fowler is an 65 y.o. male past medical history significant for diabetes mellitus, essential hypertension, CVA chronic diastolic heart failure and hyperlipidemia presents with fever and confusion.  He has not been vaccinated tested positive for COVID.  Assessment/Plan:   PNA 2/2 COVID-19 pneumonia and possibly bacterial pneumonia/sepsis: At this point in time was satting greater 90% on room air. Respiratory failure has been rule out. Still remains 5 with a leukocytosis and left shift we will start empirically on antibiotics. He was started on IV remdesivir by admitting physician will continue for total 5 days. Out of bed to chair, continue incentive spirometry  and consult physical therapy. He was started on IV steroids which we will hold as he is not hypoxic  Diabetes mellitus type 2 with hyperglycemia: Hold oral hypoglycemic agents. Continue Neurontin for his peripheral neuropathy.  Hypertension: Continue Norvasc.  Dyslipidemia: Continue statins.  Acute kidney injury on chronic kidney disease stage IIIb: In 2021 his creatinine was 2.1, on admission 3.4,  History of CVA: Continue Plavix and Imdur.  Chronic diastolic heart failure: Appears to be compensated   DVT prophylaxis: lovenox Family Communication:none Status is: Inpatient  Remains inpatient appropriate because:Hemodynamically unstable  Dispo: The patient is from: Home              Anticipated d/c is to: Home              Patient currently is not medically stable to d/c.   Difficult to place patient No    Code Status:     Code Status Orders  (From admission, onward)           Start     Ordered   03/02/21 1021  Full code  Continuous        03/02/21 1022           Code Status History     Date Active Date Inactive Code  Status Order ID Comments User Context   09/03/2015 1753 09/04/2015 2057 Full Code [id number]  Rama, Venetia Maxon, MD Inpatient         IV Access:   Peripheral IV   Procedures and diagnostic studies:   CT HEAD WO CONTRAST  Result Date: 03/01/2021 CLINICAL DATA:  Mental status change of unknown cause. High fever today and confusion. EXAM: CT HEAD WITHOUT CONTRAST TECHNIQUE: Contiguous axial images were obtained from the base of the skull through the vertex without intravenous contrast. COMPARISON:  CT head 06/10/2020.  MRI brain 06/10/2020. FINDINGS: Brain: No evidence of acute infarction, hemorrhage, hydrocephalus, extra-axial collection or mass lesion/mass effect. Mild cerebral atrophy. Vascular: No hyperdense vessel or unexpected calcification. Skull: Normal. Negative for fracture or focal lesion. Sinuses/Orbits: No acute finding. Other: None. IMPRESSION: No acute intracranial abnormalities. Electronically Signed   By: Lucienne Capers M.D.   On: 03/01/2021 22:54   DG Chest Portable 1 View  Result Date: 03/01/2021 CLINICAL DATA:  Sepsis. EXAM: PORTABLE CHEST 1 VIEW COMPARISON:  None. FINDINGS: Mild cardiomegaly with mild vascular congestion. No focal consolidation, pleural effusion or pneumothorax. Degenerative changes of the spine. No acute osseous pathology. IMPRESSION: Mild cardiomegaly with mild vascular congestion. No focal consolidation. Electronically Signed   By: Anner Crete M.D.   On: 03/01/2021 23:21     Medical Consultants:   None.   Subjective:  [name] relates he has significant sore throat has no appetite.  Objective:    Vitals:   03/02/21 2156 03/02/21 2325 03/03/21 0438 03/03/21 0842  BP:  (!) 155/85 (!) 176/99 (!) 166/97  Pulse:  85 89 80  Resp:  '18 18 18  '$ Temp: 98.9 F (37.2 C) 99.6 F (37.6 C) 99.1 F (37.3 C) 99.4 F (37.4 C)  TempSrc: Oral Oral Oral Oral  SpO2:  95% 98% 98%  Weight:  86.4 kg    Height:  '6\' 2"'$  (1.88 m)     SpO2: 98  %   Intake/Output Summary (Last 24 hours) at 03/03/2021 1132 Last data filed at 03/02/2021 1333 Gross per 24 hour  Intake 1250 ml  Output --  Net 1250 ml   Filed Weights   03/01/21 2302 03/02/21 2325  Weight: 84.7 kg 86.4 kg    Exam: General exam: In no acute distress. Respiratory system: Good air movement and lungs are clear. Cardiovascular system: S1 & S2 heard, RRR. No JVD. Gastrointestinal system: Abdomen is nondistended, soft and nontender.  Extremities: No pedal edema. Skin: No rashes, lesions or ulcers Psychiatry: Judgement and insight appear normal. Mood & affect appropriate.    Data Reviewed:    Labs: Basic Metabolic Panel: Recent Labs  Lab 03/01/21 2233 03/03/21 0246  NA 134* 137  K 4.5 4.8  CL 99 104  CO2 21* 18*  GLUCOSE 158* 194*  BUN 40* 61*  CREATININE 3.29* 3.43*  CALCIUM 9.1 8.9  MG  --  2.0  PHOS  --  5.2*   GFR Estimated Creatinine Clearance: 25 mL/min (A) (by C-G formula based on SCr of 3.43 mg/dL (H)). Liver Function Tests: Recent Labs  Lab 03/01/21 2233 03/03/21 0246  AST 23 24  ALT 23 21  ALKPHOS 138* 102  BILITOT 0.9 0.9  PROT 7.7 6.6  ALBUMIN 4.2 3.2*   No results for input(s): LIPASE, AMYLASE in the last 168 hours. No results for input(s): AMMONIA in the last 168 hours. Coagulation profile Recent Labs  Lab 03/01/21 2233  INR 1.2   COVID-19 Labs  Recent Labs    03/02/21 1127 03/03/21 0246  DDIMER 1.94* 1.53*  FERRITIN 164 241  LDH 182  --   CRP 10.2* 18.2*    Lab Results  Component Value Date   SARSCOV2NAA POSITIVE (A) 03/01/2021    CBC: Recent Labs  Lab 03/01/21 2233 03/03/21 0246  WBC 13.5* 15.7*  NEUTROABS 11.4* 14.3*  HGB 14.1 14.2  HCT 44.9 43.2  MCV 86.8 84.2  PLT 170 133*   Cardiac Enzymes: No results for input(s): CKTOTAL, CKMB, CKMBINDEX, TROPONINI in the last 168 hours. BNP (last 3 results) No results for input(s): PROBNP in the last 8760 hours. CBG: Recent Labs  Lab 03/02/21 1225  03/02/21 1707 03/02/21 2228 03/03/21 0732 03/03/21 1105  GLUCAP 127* 183* 151* 224* 247*   D-Dimer: Recent Labs    03/02/21 1127 03/03/21 0246  DDIMER 1.94* 1.53*   Hgb A1c: Recent Labs    03/02/21 1127  HGBA1C 6.3*   Lipid Profile: No results for input(s): CHOL, HDL, LDLCALC, TRIG, CHOLHDL, LDLDIRECT in the last 72 hours. Thyroid function studies: No results for input(s): TSH, T4TOTAL, T3FREE, THYROIDAB in the last 72 hours.  Invalid input(s): FREET3 Anemia work up: Recent Labs    03/02/21 1127 03/03/21 0246  FERRITIN 164 241   Sepsis Labs: Recent Labs  Lab 03/01/21 2233 03/01/21 2234 03/02/21 1127 03/03/21 0246  PROCALCITON  --   --  5.34  --   WBC 13.5*  --   --  15.7*  LATICACIDVEN  --  1.5  --   --    Microbiology Recent Results (from the past 240 hour(s))  Blood Cultures (routine x 2)     Status: None (Preliminary result)   Collection Time: 03/01/21 10:15 PM   Specimen: BLOOD  Result Value Ref Range Status   Specimen Description BLOOD SITE NOT SPECIFIED  Final   Special Requests   Final    BOTTLES DRAWN AEROBIC AND ANAEROBIC Blood Culture adequate volume   Culture   Final    NO GROWTH 2 DAYS Performed at [facility], [address] [place], [place] [address]    Report Status PENDING  Incomplete  Blood Cultures (routine x 2)     Status: None (Preliminary result)   Collection Time: 03/01/21 10:30 PM   Specimen: BLOOD  Result Value Ref Range Status   Specimen Description BLOOD SITE NOT SPECIFIED  Final   Special Requests AEROBIC BOTTLE ONLY Blood Culture adequate volume  Final   Culture   Final    NO GROWTH 2 DAYS Performed at [facility], [place] [address], [place], [place] [address]    Report Status PENDING  Incomplete  Resp Panel by RT-PCR (Flu A&B, Covid) Nasopharyngeal Swab     Status: Abnormal   Collection Time: 03/01/21 11:20 PM   Specimen: Nasopharyngeal Swab; Nasopharyngeal(NP) swabs in vial transport medium  Result Value  Ref Range Status   SARS Coronavirus 2 by RT PCR POSITIVE (A) NEGATIVE Final    Comment: RESULT CALLED TO, READ BACK BY AND VERIFIED WITH: C CARTER,RN'@0034'$  03/02/21 [place] (NOTE) SARS-CoV-2 target nucleic acids are DETECTED.  The SARS-CoV-2 RNA is generally detectable in upper respiratory specimens during the acute phase of infection. Positive results are indicative of the presence of the identified virus, but do not rule out bacterial infection or co-infection with other pathogens not detected by the test. Clinical correlation with patient history and other diagnostic information is necessary to determine patient infection status. The expected result is Negative.  Fact Sheet for Patients: EntrepreneurPulse.com.au  Fact Sheet for Healthcare Providers: IncredibleEmployment.be  This test is not yet approved or cleared by the [place] FDA and  has been authorized for detection and/or diagnosis of SARS-CoV-2 by FDA under an Emergency Use Authorization (EUA).  This EUA will remain in effect (meaning this test can be used ) for the duration of  the COVID-19 declaration under Section 564(b)(1) of the Act, 21 U.S.C. section 360bbb-3(b)(1), unless the authorization is terminated or revoked sooner.     Influenza A by PCR NEGATIVE NEGATIVE Final   Influenza B by PCR NEGATIVE NEGATIVE Final    Comment: (NOTE) The Xpert Xpress SARS-CoV-2/FLU/RSV plus assay is intended as an aid in the diagnosis of influenza from Nasopharyngeal swab specimens and should not be used as a sole basis for treatment. Nasal washings and aspirates are unacceptable for Xpert Xpress SARS-CoV-2/FLU/RSV testing.  Fact Sheet for Patients: EntrepreneurPulse.com.au  Fact Sheet for Healthcare Providers: IncredibleEmployment.be  This test is not yet approved or cleared by the [place] FDA and has been authorized for detection and/or diagnosis  of SARS-CoV-2 by FDA under an Emergency Use Authorization (EUA). This EUA will remain in effect (meaning this test can be used) for the duration of the COVID-19 declaration under Section 564(b)(1) of the Act, 21 U.S.C. section 360bbb-3(b)(1), unless the authorization is terminated or revoked.  Performed at [facility]  [facility], [place] [address], [place], [place] [address]      Medications:    amLODipine  10 mg Oral Daily   vitamin C  500 mg Oral Daily   clopidogrel  75 mg Oral Daily   docusate sodium  100 mg Oral BID   enoxaparin (LOVENOX) injection  30 mg Subcutaneous Daily   gabapentin  100 mg Oral TID   insulin aspart  0-15 Units Subcutaneous TID WC   insulin aspart  0-5 Units Subcutaneous QHS   isosorbide mononitrate  30 mg Oral Daily   methylPREDNISolone (SOLU-MEDROL) injection  40 mg Intravenous BID   Followed by   Derrill Memo ON 03/05/2021] predniSONE  50 mg Oral Daily   sodium chloride flush  3 mL Intravenous Q12H   sodium chloride flush  3 mL Intravenous Q12H   zinc sulfate  220 mg Oral Daily   Continuous Infusions:  sodium chloride 250 mL (03/03/21 1112)   remdesivir 100 mg in NS 100 mL 100 mg (03/03/21 1113)      LOS: 1 day   Charlynne Cousins  [facility]  03/03/2021, 11:32 AM

## 2021-03-03 NOTE — Progress Notes (Signed)
Mobility Specialist: Progress Note   03/03/21 1625  Mobility  Activity Transferred:  Chair to bed  Level of Assistance Standby assist, set-up cues, supervision of patient - no hands on  Assistive Device None  Distance Ambulated (ft) 3 ft  Mobility Out of bed to chair with meals  Mobility Response Tolerated well  Mobility performed by Mobility specialist  $Mobility charge 1 Mobility   Pt standby assist to transfer from the chair to the bed. Offered to assist pt to ambulate in the room but pt declined and just wanted to return to bed. Pt is set up with his dinner with call bell and phone at his side. Bed alarm is on.   [facility] [place] Mobility Specialist Mobility Specialist Phone: [phone]

## 2021-03-03 NOTE — Progress Notes (Addendum)
Pt tolerated movement from chair to bed and bed to chair well with cane throughout shift.

## 2021-03-04 LAB — COMPREHENSIVE METABOLIC PANEL
ALT: 20 U/L (ref 0–44)
AST: 21 U/L (ref 15–41)
Albumin: 2.9 g/dL — ABNORMAL LOW (ref 3.5–5.0)
Alkaline Phosphatase: 97 U/L (ref 38–126)
Anion gap: 12 (ref 5–15)
BUN: 90 mg/dL — ABNORMAL HIGH (ref 8–23)
CO2: 20 mmol/L — ABNORMAL LOW (ref 22–32)
Calcium: 8.5 mg/dL — ABNORMAL LOW (ref 8.9–10.3)
Chloride: 105 mmol/L (ref 98–111)
Creatinine, Ser: 3.68 mg/dL — ABNORMAL HIGH (ref 0.61–1.24)
GFR, Estimated: 17 mL/min — ABNORMAL LOW (ref >60)
Glucose, Bld: 143 mg/dL — ABNORMAL HIGH (ref 70–99)
Potassium: 4.5 mmol/L (ref 3.5–5.1)
Sodium: 137 mmol/L (ref 135–145)
Total Bilirubin: 0.4 mg/dL (ref 0.3–1.2)
Total Protein: 6.2 g/dL — ABNORMAL LOW (ref 6.5–8.1)

## 2021-03-04 LAB — GLUCOSE, CAPILLARY
Glucose-Capillary: 208 mg/dL — ABNORMAL HIGH (ref 70–99)
Glucose-Capillary: 266 mg/dL — ABNORMAL HIGH (ref 70–99)
Glucose-Capillary: 264 mg/dL — ABNORMAL HIGH (ref 70–99)
Glucose-Capillary: 262 mg/dL — ABNORMAL HIGH (ref 70–99)

## 2021-03-04 LAB — CBC WITH DIFFERENTIAL/PLATELET
Abs Immature Granulocytes: 0.04 10*3/uL (ref 0.00–0.07)
Basophils Absolute: 0 10*3/uL (ref 0.0–0.1)
Basophils Relative: 0 %
Eosinophils Absolute: 0 10*3/uL (ref 0.0–0.5)
Eosinophils Relative: 0 %
HCT: 41.9 % (ref 39.0–52.0)
Hemoglobin: 14.1 g/dL (ref 13.0–17.0)
Immature Granulocytes: 0 %
Lymphocytes Relative: 11 %
Lymphs Abs: 1.4 10*3/uL (ref 0.7–4.0)
MCH: 28.1 pg (ref 26.0–34.0)
MCHC: 33.7 g/dL (ref 30.0–36.0)
MCV: 83.6 fL (ref 80.0–100.0)
Monocytes Absolute: 0.9 10*3/uL (ref 0.1–1.0)
Monocytes Relative: 7 %
Neutro Abs: 11 10*3/uL — ABNORMAL HIGH (ref 1.7–7.7)
Neutrophils Relative %: 82 %
Platelets: 173 10*3/uL (ref 150–400)
RBC: 5.01 MIL/uL (ref 4.22–5.81)
RDW: 13.4 % (ref 11.5–15.5)
WBC: 13.3 10*3/uL — ABNORMAL HIGH (ref 4.0–10.5)
nRBC: 0 % (ref 0.0–0.2)

## 2021-03-04 LAB — C-REACTIVE PROTEIN: CRP: 12.5 mg/dL — ABNORMAL HIGH (ref <1.0)

## 2021-03-04 LAB — D-DIMER, QUANTITATIVE: D-Dimer, Quant: 1.17 ug/mL-FEU — ABNORMAL HIGH (ref 0.00–0.50)

## 2021-03-04 LAB — PHOSPHORUS: Phosphorus: 5.3 mg/dL — ABNORMAL HIGH (ref 2.5–4.6)

## 2021-03-04 LAB — MAGNESIUM: Magnesium: 2.2 mg/dL (ref 1.7–2.4)

## 2021-03-04 LAB — FERRITIN: Ferritin: 260 ng/mL (ref 24–336)

## 2021-03-04 MED ORDER — PREDNISONE 5 MG PO TABS
5.0000 mg | ORAL_TABLET | Freq: Every day | ORAL | Status: AC
  Administered 2021-03-05: 5 mg via ORAL
  Filled 2021-03-04: qty 1

## 2021-03-04 NOTE — Progress Notes (Signed)
[facility] PROGRESS NOTE    Progress Note  Gabriel Fowler  [id number] DOB: 1956-04-28 DOA: 03/01/2021 PCP: Elwyn Reach, MD     Brief Narrative:   Gabriel Fowler is an 65 y.o. male past medical history significant for diabetes mellitus, essential hypertension, CVA chronic diastolic heart failure and hyperlipidemia presents with fever and confusion.  He has not been vaccinated tested positive for COVID.  Assessment/Plan:   PNA 2/2 COVID-19 pneumonia and possibly bacterial pneumonia/sepsis: Satting greater than 90% on room air. Now afebrile and leukocytosis improving. Continue IV remdesivir, continue IV Rocephin and azithromycin can transition him to oral antibiotics tomorrow. If he remains stable can be discharged tomorrow morning.  Diabetes mellitus type 2 with hyperglycemia: Hold oral hypoglycemic agents. Continue Neurontin for his peripheral neuropathy.  Hypertension: Continue Norvasc.  Dyslipidemia: Continue statins.  Acute kidney injury on chronic kidney disease stage IIIb: In 2021 his creatinine was 2.1, on admission 3.4,  History of CVA: Continue Plavix and Imdur.  Chronic diastolic heart failure: Appears to be compensated   DVT prophylaxis: lovenox Family Communication:none Status is: Inpatient  Remains inpatient appropriate because:Hemodynamically unstable  Dispo: The patient is from: Home              Anticipated d/c is to: Home              Patient currently is not medically stable to d/c.   Difficult to place patient No    Code Status:     Code Status Orders  (From admission, onward)           Start     Ordered   03/02/21 1021  Full code  Continuous        03/02/21 1022           Code Status History     Date Active Date Inactive Code Status Order ID Comments User Context   09/03/2015 1753 09/04/2015 2057 Full Code [id number]  Rama, Venetia Maxon, MD Inpatient         IV Access:   Peripheral IV   Procedures  and diagnostic studies:   No results found.   Medical Consultants:   None.   Subjective:    Gabriel Fowler appetite has returned he relates he feels much better than yesterday.  Objective:    Vitals:   03/03/21 1940 03/04/21 0635 03/04/21 0834 03/04/21 0839  BP: (!) 150/82 (!) 146/82 (!) 146/78   Pulse: 74 69  78  Resp: 15 17    Temp: 98 F (36.7 C) 98.2 F (36.8 C) 98.1 F (36.7 C)   TempSrc: Oral Oral Oral   SpO2: 98%   98%  Weight:      Height:       SpO2: 98 %  No intake or output data in the 24 hours ending 03/04/21 1036  Filed Weights   03/01/21 2302 03/02/21 2325  Weight: 84.7 kg 86.4 kg    Exam: General exam: In no acute distress. Respiratory system: Good air movement and clear to auscultation. Cardiovascular system: S1 & S2 heard, RRR. No JVD. Gastrointestinal system: Abdomen is nondistended, soft and nontender.  Extremities: No pedal edema. Skin: No rashes, lesions or ulcers Psychiatry: Judgement and insight appear normal. Mood & affect appropriate.   Data Reviewed:    Labs: Basic Metabolic Panel: Recent Labs  Lab 03/01/21 2233 03/03/21 0246 03/04/21 0214  NA 134* 137 137  K 4.5 4.8 4.5  CL 99 104 105  CO2 21* 18* 20*  GLUCOSE 158* 194* 143*  BUN 40* 61* 90*  CREATININE 3.29* 3.43* 3.68*  CALCIUM 9.1 8.9 8.5*  MG  --  2.0 2.2  PHOS  --  5.2* 5.3*    GFR Estimated Creatinine Clearance: 23.3 mL/min (A) (by C-G formula based on SCr of 3.68 mg/dL (H)). Liver Function Tests: Recent Labs  Lab 03/01/21 2233 03/03/21 0246 03/04/21 0214  AST '23 24 21  '$ ALT '23 21 20  '$ ALKPHOS 138* 102 97  BILITOT 0.9 0.9 0.4  PROT 7.7 6.6 6.2*  ALBUMIN 4.2 3.2* 2.9*    No results for input(s): LIPASE, AMYLASE in the last 168 hours. No results for input(s): AMMONIA in the last 168 hours. Coagulation profile Recent Labs  Lab 03/01/21 2233  INR 1.2    COVID-19 Labs  Recent Labs    03/02/21 1127 03/03/21 0246 03/04/21 0214  DDIMER  1.94* 1.53* 1.17*  FERRITIN 164 241 260  LDH 182  --   --   CRP 10.2* 18.2* 12.5*     Lab Results  Component Value Date   SARSCOV2NAA POSITIVE (A) 03/01/2021    CBC: Recent Labs  Lab 03/01/21 2233 03/03/21 0246 03/04/21 0214  WBC 13.5* 15.7* 13.3*  NEUTROABS 11.4* 14.3* 11.0*  HGB 14.1 14.2 14.1  HCT 44.9 43.2 41.9  MCV 86.8 84.2 83.6  PLT 170 133* 173    Cardiac Enzymes: No results for input(s): CKTOTAL, CKMB, CKMBINDEX, TROPONINI in the last 168 hours. BNP (last 3 results) No results for input(s): PROBNP in the last 8760 hours. CBG: Recent Labs  Lab 03/03/21 0732 03/03/21 1105 03/03/21 1613 03/03/21 2117 03/04/21 0812  GLUCAP 224* 247* 362* 219* 208*    D-Dimer: Recent Labs    03/03/21 0246 03/04/21 0214  DDIMER 1.53* 1.17*    Hgb A1c: Recent Labs    03/02/21 1127  HGBA1C 6.3*    Lipid Profile: No results for input(s): CHOL, HDL, LDLCALC, TRIG, CHOLHDL, LDLDIRECT in the last 72 hours. Thyroid function studies: No results for input(s): TSH, T4TOTAL, T3FREE, THYROIDAB in the last 72 hours.  Invalid input(s): FREET3 Anemia work up: Recent Labs    03/03/21 0246 03/04/21 0214  FERRITIN 241 260    Sepsis Labs: Recent Labs  Lab 03/01/21 2233 03/01/21 2234 03/02/21 1127 03/03/21 0246 03/04/21 0214  PROCALCITON  --   --  5.34  --   --   WBC 13.5*  --   --  15.7* 13.3*  LATICACIDVEN  --  1.5  --   --   --     Microbiology Recent Results (from the past 240 hour(s))  Blood Cultures (routine x 2)     Status: None (Preliminary result)   Collection Time: 03/01/21 10:15 PM   Specimen: BLOOD  Result Value Ref Range Status   Specimen Description BLOOD SITE NOT SPECIFIED  Final   Special Requests   Final    BOTTLES DRAWN AEROBIC AND ANAEROBIC Blood Culture adequate volume   Culture   Final    NO GROWTH 2 DAYS Performed at [facility], [place] [address] [place], [place] [address]    Report Status PENDING  Incomplete  Blood Cultures  (routine x 2)     Status: None (Preliminary result)   Collection Time: 03/01/21 10:30 PM   Specimen: BLOOD  Result Value Ref Range Status   Specimen Description BLOOD SITE NOT SPECIFIED  Final   Special Requests AEROBIC BOTTLE ONLY Blood Culture adequate volume  Final   Culture  Final    NO GROWTH 2 DAYS Performed at [facility], [place] [address], [place], [place] [address]    Report Status PENDING  Incomplete  Resp Panel by RT-PCR (Flu A&B, Covid) Nasopharyngeal Swab     Status: Abnormal   Collection Time: 03/01/21 11:20 PM   Specimen: Nasopharyngeal Swab; Nasopharyngeal(NP) swabs in vial transport medium  Result Value Ref Range Status   SARS Coronavirus 2 by RT PCR POSITIVE (A) NEGATIVE Final    Comment: RESULT CALLED TO, READ BACK BY AND VERIFIED WITH: C CARTER,RN'@0034'$  03/02/21 [place] (NOTE) SARS-CoV-2 target nucleic acids are DETECTED.  The SARS-CoV-2 RNA is generally detectable in upper respiratory specimens during the acute phase of infection. Positive results are indicative of the presence of the identified virus, but do not rule out bacterial infection or co-infection with other pathogens not detected by the test. Clinical correlation with patient history and other diagnostic information is necessary to determine patient infection status. The expected result is Negative.  Fact Sheet for Patients: EntrepreneurPulse.com.au  Fact Sheet for Healthcare Providers: IncredibleEmployment.be  This test is not yet approved or cleared by the [place] FDA and  has been authorized for detection and/or diagnosis of SARS-CoV-2 by FDA under an Emergency Use Authorization (EUA).  This EUA will remain in effect (meaning this test can be used ) for the duration of  the COVID-19 declaration under Section 564(b)(1) of the Act, 21 U.S.C. section 360bbb-3(b)(1), unless the authorization is terminated or revoked sooner.     Influenza A by PCR  NEGATIVE NEGATIVE Final   Influenza B by PCR NEGATIVE NEGATIVE Final    Comment: (NOTE) The Xpert Xpress SARS-CoV-2/FLU/RSV plus assay is intended as an aid in the diagnosis of influenza from Nasopharyngeal swab specimens and should not be used as a sole basis for treatment. Nasal washings and aspirates are unacceptable for Xpert Xpress SARS-CoV-2/FLU/RSV testing.  Fact Sheet for Patients: EntrepreneurPulse.com.au  Fact Sheet for Healthcare Providers: IncredibleEmployment.be  This test is not yet approved or cleared by the [place] FDA and has been authorized for detection and/or diagnosis of SARS-CoV-2 by FDA under an Emergency Use Authorization (EUA). This EUA will remain in effect (meaning this test can be used) for the duration of the COVID-19 declaration under Section 564(b)(1) of the Act, 21 U.S.C. section 360bbb-3(b)(1), unless the authorization is terminated or revoked.  Performed at [facility], [place] [address] [place], [place] [address]      Medications:    amLODipine  10 mg Oral Daily   vitamin C  500 mg Oral Daily   clopidogrel  75 mg Oral Daily   docusate sodium  100 mg Oral BID   enoxaparin (LOVENOX) injection  30 mg Subcutaneous Daily   gabapentin  100 mg Oral TID   insulin aspart  0-5 Units Subcutaneous QHS   insulin aspart  0-9 Units Subcutaneous TID WC   insulin aspart  3 Units Subcutaneous TID WC   isosorbide mononitrate  30 mg Oral Daily   polyethylene glycol  17 g Oral Daily   predniSONE  10 mg Oral Q breakfast   sodium chloride flush  3 mL Intravenous Q12H   sodium chloride flush  3 mL Intravenous Q12H   zinc sulfate  220 mg Oral Daily   Continuous Infusions:  sodium chloride 250 mL (03/03/21 1112)   azithromycin 500 mg (03/03/21 1446)   cefTRIAXone (ROCEPHIN)  IV 2 g (03/03/21 1251)   remdesivir 100 mg in NS 100  mL 100 mg (03/03/21 1113)      LOS: 2 days   Charlynne Cousins  [facility]  03/04/2021, 10:36 AM

## 2021-03-05 ENCOUNTER — Other Ambulatory Visit (HOSPITAL_COMMUNITY): Payer: Self-pay

## 2021-03-05 LAB — GLUCOSE, CAPILLARY
Glucose-Capillary: 147 mg/dL — ABNORMAL HIGH (ref 70–99)
Glucose-Capillary: 219 mg/dL — ABNORMAL HIGH (ref 70–99)

## 2021-03-05 LAB — COMPREHENSIVE METABOLIC PANEL
ALT: 40 U/L (ref 0–44)
AST: 37 U/L (ref 15–41)
Albumin: 2.8 g/dL — ABNORMAL LOW (ref 3.5–5.0)
Alkaline Phosphatase: 91 U/L (ref 38–126)
Anion gap: 10 (ref 5–15)
BUN: 90 mg/dL — ABNORMAL HIGH (ref 8–23)
CO2: 23 mmol/L (ref 22–32)
Calcium: 8.1 mg/dL — ABNORMAL LOW (ref 8.9–10.3)
Chloride: 105 mmol/L (ref 98–111)
Creatinine, Ser: 3.68 mg/dL — ABNORMAL HIGH (ref 0.61–1.24)
GFR, Estimated: 17 mL/min — ABNORMAL LOW (ref >60)
Glucose, Bld: 149 mg/dL — ABNORMAL HIGH (ref 70–99)
Potassium: 4.1 mmol/L (ref 3.5–5.1)
Sodium: 138 mmol/L (ref 135–145)
Total Bilirubin: 0.5 mg/dL (ref 0.3–1.2)
Total Protein: 5.9 g/dL — ABNORMAL LOW (ref 6.5–8.1)

## 2021-03-05 LAB — CBC WITH DIFFERENTIAL/PLATELET
Abs Immature Granulocytes: 0.06 10*3/uL (ref 0.00–0.07)
Basophils Absolute: 0 10*3/uL (ref 0.0–0.1)
Basophils Relative: 0 %
Eosinophils Absolute: 0 10*3/uL (ref 0.0–0.5)
Eosinophils Relative: 0 %
HCT: 40.7 % (ref 39.0–52.0)
Hemoglobin: 13.4 g/dL (ref 13.0–17.0)
Immature Granulocytes: 1 %
Lymphocytes Relative: 20 %
Lymphs Abs: 2 10*3/uL (ref 0.7–4.0)
MCH: 27.5 pg (ref 26.0–34.0)
MCHC: 32.9 g/dL (ref 30.0–36.0)
MCV: 83.6 fL (ref 80.0–100.0)
Monocytes Absolute: 0.7 10*3/uL (ref 0.1–1.0)
Monocytes Relative: 8 %
Neutro Abs: 6.9 10*3/uL (ref 1.7–7.7)
Neutrophils Relative %: 71 %
Platelets: 186 10*3/uL (ref 150–400)
RBC: 4.87 MIL/uL (ref 4.22–5.81)
RDW: 13.2 % (ref 11.5–15.5)
WBC: 9.7 10*3/uL (ref 4.0–10.5)
nRBC: 0 % (ref 0.0–0.2)

## 2021-03-05 LAB — C-REACTIVE PROTEIN: CRP: 7 mg/dL — ABNORMAL HIGH (ref <1.0)

## 2021-03-05 LAB — D-DIMER, QUANTITATIVE: D-Dimer, Quant: 0.64 ug/mL-FEU — ABNORMAL HIGH (ref 0.00–0.50)

## 2021-03-05 LAB — FERRITIN: Ferritin: 177 ng/mL (ref 24–336)

## 2021-03-05 LAB — PHOSPHORUS: Phosphorus: 4.4 mg/dL (ref 2.5–4.6)

## 2021-03-05 LAB — MAGNESIUM: Magnesium: 2.3 mg/dL (ref 1.7–2.4)

## 2021-03-05 MED ORDER — LISINOPRIL 10 MG PO TABS: 10.0000 mg | ORAL_TABLET | Freq: Every day | ORAL | 3 refills | Status: DC

## 2021-03-05 MED ORDER — LISINOPRIL 10 MG PO TABS
10.0000 mg | ORAL_TABLET | Freq: Every day | ORAL | Status: DC
  Filled 2021-03-05: qty 1

## 2021-03-05 MED ORDER — AMOXICILLIN-POT CLAVULANATE 875-125 MG PO TABS
1.0000 | ORAL_TABLET | Freq: Two times a day (BID) | ORAL | 0 refills | Status: AC
  Filled 2021-03-05: qty 6, 3d supply, fill #0

## 2021-03-05 MED ORDER — AMOXICILLIN-POT CLAVULANATE 875-125 MG PO TABS: 1.0000 | ORAL_TABLET | Freq: Two times a day (BID) | ORAL | 0 refills | Status: DC

## 2021-03-05 MED ORDER — AZITHROMYCIN 500 MG PO TABS: 500.0000 mg | ORAL_TABLET | Freq: Every day | ORAL | 0 refills | Status: DC

## 2021-03-05 MED ORDER — ISOSORBIDE MONONITRATE ER 30 MG PO TB24
60.0000 mg | ORAL_TABLET | Freq: Every day | ORAL | 0 refills | Status: DC
  Filled 2021-03-05: qty 30, 15d supply, fill #0

## 2021-03-05 MED ORDER — AZITHROMYCIN 500 MG PO TABS
500.0000 mg | ORAL_TABLET | Freq: Every day | ORAL | 0 refills | Status: AC
  Filled 2021-03-05: qty 3, 3d supply, fill #0

## 2021-03-05 NOTE — Discharge Summary (Addendum)
Physician Discharge Summary  Gabriel Fowler [id number] DOB: Aug 19, 1955 DOA: 03/01/2021  PCP: Elwyn Reach, MD  Admit date: 03/01/2021 Discharge date: 03/05/2021  Admitted From: Home Disposition:  SNF  Recommendations for Outpatient Follow-up:  Follow up with PCP in 1-2 weeks, titrate antihypertensive medication as tolerated.  Consider adding a diuretic therapy. Please obtain BMP/CBC in one week   Home Health:no Equipment/Devices:none  Discharge Condition:Stable CODE STATUS:Full Diet recommendation: Heart Healthy   Brief/Interim Summary:  65 y.o. male past medical history significant for diabetes mellitus, essential hypertension, CVA chronic diastolic heart failure and hyperlipidemia presents with fever and confusion.  He has not been vaccinated tested positive for COVID  Discharge Diagnoses:  Principal Problem:   COVID-19 Active Problems:   Diabetes mellitus without complication ([place])   Hypertension   Hypercholesteremia   Chronic renal failure, stage 3 (moderate) (HCC)   Chronic diastolic CHF (congestive heart failure) (HCC)  Acute respiratory failure with hypoxia possibly multifactorial due to COVID-19 pneumonia possibly bacterial infection pneumonia: He was placed on 2 L of oxygen started empirically on IV remdesivir, IV Rocephin and azithromycin. He defervesced leukocytosis improved he completed course of IV remdesivir in house. Once he was weaned to air he was changed to oral antibiotics which continue 2 more days as an outpatient.  Diabetes mellitus type 2 with hyperglycemia: No change made to his oral hypoglycemic agents he will continue glipizide.  Hemoglobin A1c is 6.3.  Essential hypertension: He was continued on Norvasc, as his blood pressure remained up he was started on oral lisinopril which she will follow-up with PCP as an outpatient recheck a basic metabolic panel in a week.  Acute kidney injury on chronic kidney disease stage IIIb: He was started on  IV fluids and his creatinine stabilized at 3.6, his previous creatinine almost 2 years ago was 2.1. At home he is not on an ACE inhibitor he has been using meloxicam and has had uncontrolled hypertension here in house which I suspect is been uncontrolled at home, he has been advised to discontinue meloxicam. This is probably progression of his renal disease due to his uncontrolled hypertension and probably NSAID use he will follow-up with PCP as an outpatient in 1 week check a basic metabolic panel then. At this point in time his Imdur was increased he was not started on an ACE inhibitor due to his GFR being less than 20. Will need to be referred to the nephrology as an outpatient.  History of CVA: Continue Plavix.  Chronic diastolic heart failure: Appears to be compensated continue Norvasc he was started on lisinopril.  Discharge Instructions  Discharge Instructions     Diet - low sodium heart healthy   Complete by: As directed    Increase activity slowly   Complete by: As directed       Allergies as of 03/05/2021   No Known Allergies      Medication List     STOP taking these medications    meloxicam 7.5 MG tablet Commonly known as: MOBIC       TAKE these medications    amoxicillin-clavulanate 875-125 MG tablet Commonly known as: Augmentin Take 1 tablet by mouth 2 (two) times daily for 3 days.   azithromycin 500 MG tablet Commonly known as: Zithromax Take 1 tablet (500 mg total) by mouth daily for 3 days. Take 1 tablet daily for 3 days.   clopidogrel 75 MG tablet Commonly known as: Plavix Take 1 tablet (75 mg total) by mouth daily.  gabapentin 100 MG capsule Commonly known as: NEURONTIN Take 100 mg by mouth 3 (three) times daily.   glipiZIDE 5 MG tablet Commonly known as: GLUCOTROL Take 5 mg by mouth 2 (two) times daily.   isosorbide mononitrate 30 MG 24 hr tablet Commonly known as: IMDUR Take 30 mg by mouth daily.   lisinopril 10 MG tablet Commonly  known as: ZESTRIL Take 1 tablet (10 mg total) by mouth daily.   Norvasc 10 MG tablet Generic drug: amLODipine Take 10 mg by mouth daily.   pravastatin 40 MG tablet Commonly known as: Pravachol Take 1 tablet (40 mg total) by mouth daily.   Vitamin D3 50 MCG (2000 UT) Tabs Take 2,000 Units by mouth daily.   zinc gluconate 50 MG tablet Take 50 mg by mouth daily.        No Known Allergies  Consultations: None   Procedures/Studies: CT HEAD WO CONTRAST  Result Date: 03/01/2021 CLINICAL DATA:  Mental status change of unknown cause. High fever today and confusion. EXAM: CT HEAD WITHOUT CONTRAST TECHNIQUE: Contiguous axial images were obtained from the base of the skull through the vertex without intravenous contrast. COMPARISON:  CT head 06/10/2020.  MRI brain 06/10/2020. FINDINGS: Brain: No evidence of acute infarction, hemorrhage, hydrocephalus, extra-axial collection or mass lesion/mass effect. Mild cerebral atrophy. Vascular: No hyperdense vessel or unexpected calcification. Skull: Normal. Negative for fracture or focal lesion. Sinuses/Orbits: No acute finding. Other: None. IMPRESSION: No acute intracranial abnormalities. Electronically Signed   By: Lucienne Capers M.D.   On: 03/01/2021 22:54   DG Chest Portable 1 View  Result Date: 03/01/2021 CLINICAL DATA:  Sepsis. EXAM: PORTABLE CHEST 1 VIEW COMPARISON:  None. FINDINGS: Mild cardiomegaly with mild vascular congestion. No focal consolidation, pleural effusion or pneumothorax. Degenerative changes of the spine. No acute osseous pathology. IMPRESSION: Mild cardiomegaly with mild vascular congestion. No focal consolidation. Electronically Signed   By: Anner Crete M.D.   On: 03/01/2021 23:21   DG Finger Little Left  Result Date: 02/18/2021 CLINICAL DATA:  Left small finger injury yesterday. EXAM: LEFT LITTLE FINGER 2+V COMPARISON:  Left hand x-rays dated Oct 27, 2019. FINDINGS: There is no evidence of fracture or dislocation.  There is no evidence of arthropathy or other focal bone abnormality. Soft tissues are unremarkable. IMPRESSION: Negative. Electronically Signed   By: Titus Dubin M.D.   On: 02/18/2021 15:43   (Echo, Carotid, EGD, Colonoscopy, ERCP)    Subjective: No complaints  Discharge Exam: Vitals:   03/04/21 2200 03/05/21 0553  BP: (!) 152/80 (!) 175/85  Pulse: 68 68  Resp: 18 19  Temp: 98 F (36.7 C) (!) 97.5 F (36.4 C)  SpO2: 99% 99%   Vitals:   03/04/21 0839 03/04/21 1411 03/04/21 2200 03/05/21 0553  BP:  (!) 148/82 (!) 152/80 (!) 175/85  Pulse: 78 78 68 68  Resp:  '18 18 19  '$ Temp:  98.1 F (36.7 C) 98 F (36.7 C) (!) 97.5 F (36.4 C)  TempSrc:  Oral Oral Oral  SpO2: 98% 99% 99% 99%  Weight:      Height:        General: Pt is alert, awake, not in acute distress Cardiovascular: RRR, S1/S2 +, no rubs, no gallops Respiratory: CTA bilaterally, no wheezing, no rhonchi Abdominal: Soft, NT, ND, bowel sounds + Extremities: no edema, no cyanosis    The results of significant diagnostics from this hospitalization (including imaging, microbiology, ancillary and laboratory) are listed below for reference.     Microbiology:  Recent Results (from the past 240 hour(s))  Blood Cultures (routine x 2)     Status: None (Preliminary result)   Collection Time: 03/01/21 10:15 PM   Specimen: BLOOD  Result Value Ref Range Status   Specimen Description BLOOD SITE NOT SPECIFIED  Final   Special Requests   Final    BOTTLES DRAWN AEROBIC AND ANAEROBIC Blood Culture adequate volume   Culture   Final    NO GROWTH 3 DAYS Performed at [facility], [address] [place], [place] [address]    Report Status PENDING  Incomplete  Blood Cultures (routine x 2)     Status: None (Preliminary result)   Collection Time: 03/01/21 10:30 PM   Specimen: BLOOD  Result Value Ref Range Status   Specimen Description BLOOD SITE NOT SPECIFIED  Final   Special Requests AEROBIC BOTTLE ONLY Blood Culture  adequate volume  Final   Culture   Final    NO GROWTH 3 DAYS Performed at [facility], [address] [place], [place] [address]    Report Status PENDING  Incomplete  Resp Panel by RT-PCR (Flu A&B, Covid) Nasopharyngeal Swab     Status: Abnormal   Collection Time: 03/01/21 11:20 PM   Specimen: Nasopharyngeal Swab; Nasopharyngeal(NP) swabs in vial transport medium  Result Value Ref Range Status   SARS Coronavirus 2 by RT PCR POSITIVE (A) NEGATIVE Final    Comment: RESULT CALLED TO, READ BACK BY AND VERIFIED WITH: C CARTER,RN'@0034'$  03/02/21 [place] (NOTE) SARS-CoV-2 target nucleic acids are DETECTED.  The SARS-CoV-2 RNA is generally detectable in upper respiratory specimens during the acute phase of infection. Positive results are indicative of the presence of the identified virus, but do not rule out bacterial infection or co-infection with other pathogens not detected by the test. Clinical correlation with patient history and other diagnostic information is necessary to determine patient infection status. The expected result is Negative.  Fact Sheet for Patients: EntrepreneurPulse.com.au  Fact Sheet for Healthcare Providers: IncredibleEmployment.be  This test is not yet approved or cleared by the [place] FDA and  has been authorized for detection and/or diagnosis of SARS-CoV-2 by FDA under an Emergency Use Authorization (EUA).  This EUA will remain in effect (meaning this test can be used ) for the duration of  the COVID-19 declaration under Section 564(b)(1) of the Act, 21 U.S.C. section 360bbb-3(b)(1), unless the authorization is terminated or revoked sooner.     Influenza A by PCR NEGATIVE NEGATIVE Final   Influenza B by PCR NEGATIVE NEGATIVE Final    Comment: (NOTE) The Xpert Xpress SARS-CoV-2/FLU/RSV plus assay is intended as an aid in the diagnosis of influenza from Nasopharyngeal swab specimens and should not be used as a  sole basis for treatment. Nasal washings and aspirates are unacceptable for Xpert Xpress SARS-CoV-2/FLU/RSV testing.  Fact Sheet for Patients: EntrepreneurPulse.com.au  Fact Sheet for Healthcare Providers: IncredibleEmployment.be  This test is not yet approved or cleared by the [place] FDA and has been authorized for detection and/or diagnosis of SARS-CoV-2 by FDA under an Emergency Use Authorization (EUA). This EUA will remain in effect (meaning this test can be used) for the duration of the COVID-19 declaration under Section 564(b)(1) of the Act, 21 U.S.C. section 360bbb-3(b)(1), unless the authorization is terminated or revoked.  Performed at [facility], [place] [address] [place], [place] [address]   Culture, blood (routine x 2) Call MD if unable to obtain prior to antibiotics being given  Status: None (Preliminary result)   Collection Time: 03/03/21 11:50 AM   Specimen: BLOOD RIGHT ARM  Result Value Ref Range Status   Specimen Description BLOOD RIGHT ARM  Final   Special Requests   Final    BOTTLES DRAWN AEROBIC AND ANAEROBIC Blood Culture adequate volume   Culture   Final    NO GROWTH < 24 HOURS Performed at [facility], [address] [place], [place] [address]    Report Status PENDING  Incomplete  Culture, blood (routine x 2) Call MD if unable to obtain prior to antibiotics being given     Status: None (Preliminary result)   Collection Time: 03/03/21 11:50 AM   Specimen: BLOOD RIGHT ARM  Result Value Ref Range Status   Specimen Description BLOOD RIGHT ARM  Final   Special Requests   Final    BOTTLES DRAWN AEROBIC AND ANAEROBIC Blood Culture adequate volume   Culture   Final    NO GROWTH < 24 HOURS Performed at [facility], [place] [address], [place], [place] [address]    Report Status PENDING  Incomplete     Labs: BNP (last 3 results) No results for input(s): BNP in the last 8760 hours. Basic  Metabolic Panel: Recent Labs  Lab 03/01/21 2233 03/03/21 0246 03/04/21 0214 03/05/21 0140  NA 134* 137 137 138  K 4.5 4.8 4.5 4.1  CL 99 104 105 105  CO2 21* 18* 20* 23  GLUCOSE 158* 194* 143* 149*  BUN 40* 61* 90* 90*  CREATININE 3.29* 3.43* 3.68* 3.68*  CALCIUM 9.1 8.9 8.5* 8.1*  MG  --  2.0 2.2 2.3  PHOS  --  5.2* 5.3* 4.4   Liver Function Tests: Recent Labs  Lab 03/01/21 2233 03/03/21 0246 03/04/21 0214 03/05/21 0140  AST '23 24 21 '$ 37  ALT '23 21 20 '$ 40  ALKPHOS 138* 102 97 91  BILITOT 0.9 0.9 0.4 0.5  PROT 7.7 6.6 6.2* 5.9*  ALBUMIN 4.2 3.2* 2.9* 2.8*   No results for input(s): LIPASE, AMYLASE in the last 168 hours. No results for input(s): AMMONIA in the last 168 hours. CBC: Recent Labs  Lab 03/01/21 2233 03/03/21 0246 03/04/21 0214 03/05/21 0140  WBC 13.5* 15.7* 13.3* 9.7  NEUTROABS 11.4* 14.3* 11.0* 6.9  HGB 14.1 14.2 14.1 13.4  HCT 44.9 43.2 41.9 40.7  MCV 86.8 84.2 83.6 83.6  PLT 170 133* 173 186   Cardiac Enzymes: No results for input(s): CKTOTAL, CKMB, CKMBINDEX, TROPONINI in the last 168 hours. BNP: Invalid input(s): POCBNP CBG: Recent Labs  Lab 03/04/21 0812 03/04/21 1156 03/04/21 1717 03/04/21 2159 03/05/21 0746  GLUCAP 208* 266* 264* 262* 147*   D-Dimer Recent Labs    03/04/21 0214 03/05/21 0140  DDIMER 1.17* 0.64*   Hgb A1c Recent Labs    03/02/21 1127  HGBA1C 6.3*   Lipid Profile No results for input(s): CHOL, HDL, LDLCALC, TRIG, CHOLHDL, LDLDIRECT in the last 72 hours. Thyroid function studies No results for input(s): TSH, T4TOTAL, T3FREE, THYROIDAB in the last 72 hours.  Invalid input(s): FREET3 Anemia work up Recent Labs    03/04/21 0214 03/05/21 0140  FERRITIN 260 177   Urinalysis    Component Value Date/Time   COLORURINE YELLOW 03/02/2021 0526   APPEARANCEUR CLEAR 03/02/2021 0526   LABSPEC 1.015 03/02/2021 0526   PHURINE 7.0 03/02/2021 0526   GLUCOSEU 250 (A) 03/02/2021 0526   HGBUR MODERATE (A)  03/02/2021 0526   BILIRUBINUR NEGATIVE 03/02/2021 0526  KETONESUR NEGATIVE 03/02/2021 0526   PROTEINUR >300 (A) 03/02/2021 0526   NITRITE NEGATIVE 03/02/2021 0526   LEUKOCYTESUR NEGATIVE 03/02/2021 0526   Sepsis Labs Invalid input(s): PROCALCITONIN,  WBC,  LACTICIDVEN Microbiology Recent Results (from the past 240 hour(s))  Blood Cultures (routine x 2)     Status: None (Preliminary result)   Collection Time: 03/01/21 10:15 PM   Specimen: BLOOD  Result Value Ref Range Status   Specimen Description BLOOD SITE NOT SPECIFIED  Final   Special Requests   Final    BOTTLES DRAWN AEROBIC AND ANAEROBIC Blood Culture adequate volume   Culture   Final    NO GROWTH 3 DAYS Performed at [facility], [address], [place], [place] [address]    Report Status PENDING  Incomplete  Blood Cultures (routine x 2)     Status: None (Preliminary result)   Collection Time: 03/01/21 10:30 PM   Specimen: BLOOD  Result Value Ref Range Status   Specimen Description BLOOD SITE NOT SPECIFIED  Final   Special Requests AEROBIC BOTTLE ONLY Blood Culture adequate volume  Final   Culture   Final    NO GROWTH 3 DAYS Performed at [facility], [address], [place], [place] [address]    Report Status PENDING  Incomplete  Resp Panel by RT-PCR (Flu A&B, Covid) Nasopharyngeal Swab     Status: Abnormal   Collection Time: 03/01/21 11:20 PM   Specimen: Nasopharyngeal Swab; Nasopharyngeal(NP) swabs in vial transport medium  Result Value Ref Range Status   SARS Coronavirus 2 by RT PCR POSITIVE (A) NEGATIVE Final    Comment: RESULT CALLED TO, READ BACK BY AND VERIFIED WITH: C CARTER,RN'@0034'$  03/02/21 [place] (NOTE) SARS-CoV-2 target nucleic acids are DETECTED.  The SARS-CoV-2 RNA is generally detectable in upper respiratory specimens during the acute phase of infection. Positive results are indicative of the presence of the identified virus, but do not rule out bacterial infection or co-infection with  other pathogens not detected by the test. Clinical correlation with patient history and other diagnostic information is necessary to determine patient infection status. The expected result is Negative.  Fact Sheet for Patients: EntrepreneurPulse.com.au  Fact Sheet for Healthcare Providers: IncredibleEmployment.be  This test is not yet approved or cleared by the [place] FDA and  has been authorized for detection and/or diagnosis of SARS-CoV-2 by FDA under an Emergency Use Authorization (EUA).  This EUA will remain in effect (meaning this test can be used ) for the duration of  the COVID-19 declaration under Section 564(b)(1) of the Act, 21 U.S.C. section 360bbb-3(b)(1), unless the authorization is terminated or revoked sooner.     Influenza A by PCR NEGATIVE NEGATIVE Final   Influenza B by PCR NEGATIVE NEGATIVE Final    Comment: (NOTE) The Xpert Xpress SARS-CoV-2/FLU/RSV plus assay is intended as an aid in the diagnosis of influenza from Nasopharyngeal swab specimens and should not be used as a sole basis for treatment. Nasal washings and aspirates are unacceptable for Xpert Xpress SARS-CoV-2/FLU/RSV testing.  Fact Sheet for Patients: EntrepreneurPulse.com.au  Fact Sheet for Healthcare Providers: IncredibleEmployment.be  This test is not yet approved or cleared by the [place] FDA and has been authorized for detection and/or diagnosis of SARS-CoV-2 by FDA under an Emergency Use Authorization (EUA). This EUA will remain in effect (meaning this test can be used) for the duration of the COVID-19 declaration under Section 564(b)(1) of the Act, 21 U.S.C. section 360bbb-3(b)(1), unless the authorization is terminated or  revoked.  Performed at [facility], [place] [address], [place], [place] [address]   Culture, blood (routine x 2) Call MD if unable to obtain prior to antibiotics being given      Status: None (Preliminary result)   Collection Time: 03/03/21 11:50 AM   Specimen: BLOOD RIGHT ARM  Result Value Ref Range Status   Specimen Description BLOOD RIGHT ARM  Final   Special Requests   Final    BOTTLES DRAWN AEROBIC AND ANAEROBIC Blood Culture adequate volume   Culture   Final    NO GROWTH < 24 HOURS Performed at [facility], [place] [address], [place], [place] [address]    Report Status PENDING  Incomplete  Culture, blood (routine x 2) Call MD if unable to obtain prior to antibiotics being given     Status: None (Preliminary result)   Collection Time: 03/03/21 11:50 AM   Specimen: BLOOD RIGHT ARM  Result Value Ref Range Status   Specimen Description BLOOD RIGHT ARM  Final   Special Requests   Final    BOTTLES DRAWN AEROBIC AND ANAEROBIC Blood Culture adequate volume   Culture   Final    NO GROWTH < 24 HOURS Performed at [facility], [place] [address], [place], [place] [address]    Report Status PENDING  Incomplete     SIGNED:   Charlynne Cousins, MD  [facility] 03/05/2021, 9:28 AM Pager   If 7PM-7AM, please contact night-coverage [email or web address] Password [email or web address]

## 2021-03-06 LAB — CULTURE, BLOOD (ROUTINE X 2)
Culture: NO GROWTH
Culture: NO GROWTH
Special Requests: ADEQUATE
Special Requests: ADEQUATE

## 2021-03-08 LAB — CULTURE, BLOOD (ROUTINE X 2)
Culture: NO GROWTH
Culture: NO GROWTH
Special Requests: ADEQUATE
Special Requests: ADEQUATE

## 2021-07-26 ENCOUNTER — Other Ambulatory Visit: Payer: Self-pay | Admitting: Nephrology

## 2021-07-26 DIAGNOSIS — N184 Chronic kidney disease, stage 4 (severe): Secondary | ICD-10-CM

## 2021-10-22 ENCOUNTER — Other Ambulatory Visit: Payer: Self-pay | Admitting: Nephrology

## 2021-10-22 DIAGNOSIS — N184 Chronic kidney disease, stage 4 (severe): Secondary | ICD-10-CM

## 2021-10-28 ENCOUNTER — Ambulatory Visit (HOSPITAL_COMMUNITY)
Admission: EM | Admit: 2021-10-28 | Discharge: 2021-10-28 | Disposition: A | Discharge status: Home / Self Care | Payer: Medicare Other | Attending: Family Medicine | Admitting: Family Medicine

## 2021-10-28 ENCOUNTER — Other Ambulatory Visit: Payer: Self-pay

## 2021-10-28 ENCOUNTER — Encounter (HOSPITAL_COMMUNITY): Payer: Self-pay | Admitting: Emergency Medicine

## 2021-10-28 DIAGNOSIS — I10 Essential (primary) hypertension: Secondary | ICD-10-CM

## 2021-10-28 NOTE — ED Triage Notes (Signed)
Seen by kidney doctor 3 days ago, pressure was high, today at another office it was high and at home registered high. Usually takes amlodipine once a day, but has taken it 2 times today.  Slight discomfort in left chest

## 2021-10-28 NOTE — Discharge Instructions (Signed)
Your blood pressure was noted to be elevated during your visit today. If you are currently taking medication for high blood pressure, please ensure you are taking this as directed. If you do not have a history of high blood pressure and your blood pressure remains persistently elevated, you may need to begin taking a medication at some point. You may return here within the next few days to recheck if unable to see your primary care provider or if you do not have a one.  BP (!) 179/84 (BP Location: Right Arm)   Pulse 72   Temp 97.8 F (36.6 C) (Oral)   Resp 20   SpO2 98%   BP Readings from Last 3 Encounters:  10/28/21 (!) 179/84  03/05/21 (!) 175/85  02/18/21 (!) 187/92

## 2021-11-01 NOTE — ED Provider Notes (Signed)
  [place]   [phone] 10/28/21 Arrival Time: 2500  ASSESSMENT & PLAN:  1. Elevated blood pressure reading in office with diagnosis of hypertension    No s/s of HTN urgency. Has not been regularly taking meds. Clarified to continue:    amLODipine (NORVASC) 10 MG tablet, Take 10 mg by mouth daily., Disp: , Rfl:     isosorbide mononitrate (IMDUR) 30 MG 24 hr tablet, Take 2 tablets (60 mg total) by mouth daily. (Patient not taking: Reported on 10/28/2021), Disp: 30 tablet, Rfl: 0  Recommend:  Follow-up Information     Elwyn Reach, MD. Schedule an appointment as soon as possible for a visit .   Specialty: Internal Medicine Contact information: [address] [place] [address] [phone]                 Reviewed expectations re: course of current medical issues. Questions answered. Outlined signs and symptoms indicating need for more acute intervention. Patient verbalized understanding. After Visit Summary given.   SUBJECTIVE:  Gabriel Fowler is a 66 y.o. male who presents with concerns regarding increased blood pressures. Reports that he ist treated for HTN.  He reports not taking medications regularly as instructed, no medication side effects noted, no chest pain on exertion, no dyspnea on exertion, no swelling of ankles, no orthostatic dizziness or lightheadedness, no orthopnea or paroxysmal nocturnal dyspnea, and no palpitations.  Social History   Tobacco Use  Smoking Status Never  Smokeless Tobacco Never   OBJECTIVE:  Vitals:   10/28/21 1857  BP: (!) 179/84  Pulse: 72  Resp: 20  Temp: 97.8 F (36.6 C)  TempSrc: Oral  SpO2: 98%    General appearance: alert; no distress Eyes: PERRLA; EOMI Lungs: clear to auscultation bilaterally Heart: regular Extremities: no edema; symmetrical with no gross deformities Skin: warm and dry Psychological: alert and cooperative; normal mood and affect   No Known Allergies  Past Medical  History:  Diagnosis Date   Diabetes mellitus without complication (HCC)    Hypercholesteremia    Hypertension    Social History   Socioeconomic History   Marital status: Married    Spouse name: Not on file   Number of children: 3   Years of education: Not on file   Highest education level: Not on file  Occupational History   Occupation: Disabled  Tobacco Use   Smoking status: Never   Smokeless tobacco: Never  Vaping Use   Vaping Use: Never used  Substance and Sexual Activity   Alcohol use: No   Drug use: No   Sexual activity: Yes  Other Topics Concern   Not on file  Social History Narrative   Married.  Lives with wife.   Social Determinants of Health   Financial Resource Strain: Not on file  Food Insecurity: Not on file  Transportation Needs: Not on file  Physical Activity: Not on file  Stress: Not on file  Social Connections: Not on file  Intimate Partner Violence: Not on file   Family History  Problem Relation Age of Onset   Stroke Neg Hx    Heart disease Neg Hx    Cancer Neg Hx    Past Surgical History:  Procedure Laterality Date   EXTRA SKIN REMOVED FROM BACK OF NECK         Vanessa Kick, MD 11/01/21 1011

## 2022-02-03 ENCOUNTER — Ambulatory Visit
Admission: RE | Admit: 2022-02-03 | Discharge: 2022-02-03 | Disposition: A | Discharge status: Home / Self Care | Payer: Medicare Other | Source: Ambulatory Visit | Attending: Nephrology | Admitting: Nephrology

## 2022-02-03 DIAGNOSIS — N184 Chronic kidney disease, stage 4 (severe): Secondary | ICD-10-CM

## 2022-02-28 ENCOUNTER — Observation Stay (HOSPITAL_COMMUNITY)
Admission: EM | Admit: 2022-02-28 | Discharge: 2022-02-28 | Disposition: A | Discharge status: Home / Self Care | Payer: Medicare Other | Attending: Student | Admitting: Student

## 2022-02-28 ENCOUNTER — Encounter (HOSPITAL_COMMUNITY): Payer: Self-pay

## 2022-02-28 ENCOUNTER — Observation Stay (HOSPITAL_BASED_OUTPATIENT_CLINIC_OR_DEPARTMENT_OTHER): Payer: Medicare Other

## 2022-02-28 ENCOUNTER — Emergency Department (HOSPITAL_COMMUNITY): Payer: Medicare Other

## 2022-02-28 ENCOUNTER — Other Ambulatory Visit: Payer: Self-pay

## 2022-02-28 DIAGNOSIS — N184 Chronic kidney disease, stage 4 (severe): Secondary | ICD-10-CM | POA: Diagnosis present

## 2022-02-28 DIAGNOSIS — Z7984 Long term (current) use of oral hypoglycemic drugs: Secondary | ICD-10-CM | POA: Diagnosis not present

## 2022-02-28 DIAGNOSIS — Z8673 Personal history of transient ischemic attack (TIA), and cerebral infarction without residual deficits: Secondary | ICD-10-CM | POA: Insufficient documentation

## 2022-02-28 DIAGNOSIS — Z758 Other problems related to medical facilities and other health care: Secondary | ICD-10-CM

## 2022-02-28 DIAGNOSIS — I161 Hypertensive emergency: Secondary | ICD-10-CM | POA: Insufficient documentation

## 2022-02-28 DIAGNOSIS — E7800 Pure hypercholesterolemia, unspecified: Secondary | ICD-10-CM | POA: Diagnosis not present

## 2022-02-28 DIAGNOSIS — D72829 Elevated white blood cell count, unspecified: Secondary | ICD-10-CM

## 2022-02-28 DIAGNOSIS — E119 Type 2 diabetes mellitus without complications: Secondary | ICD-10-CM

## 2022-02-28 DIAGNOSIS — G459 Transient cerebral ischemic attack, unspecified: Principal | ICD-10-CM | POA: Diagnosis present

## 2022-02-28 DIAGNOSIS — Z91199 Patient's noncompliance with other medical treatment and regimen due to unspecified reason: Secondary | ICD-10-CM

## 2022-02-28 DIAGNOSIS — R29818 Other symptoms and signs involving the nervous system: Secondary | ICD-10-CM

## 2022-02-28 DIAGNOSIS — N289 Disorder of kidney and ureter, unspecified: Secondary | ICD-10-CM

## 2022-02-28 DIAGNOSIS — Z7902 Long term (current) use of antithrombotics/antiplatelets: Secondary | ICD-10-CM | POA: Diagnosis not present

## 2022-02-28 DIAGNOSIS — E1122 Type 2 diabetes mellitus with diabetic chronic kidney disease: Secondary | ICD-10-CM | POA: Insufficient documentation

## 2022-02-28 DIAGNOSIS — E1165 Type 2 diabetes mellitus with hyperglycemia: Secondary | ICD-10-CM | POA: Diagnosis not present

## 2022-02-28 DIAGNOSIS — R2689 Other abnormalities of gait and mobility: Secondary | ICD-10-CM | POA: Diagnosis not present

## 2022-02-28 DIAGNOSIS — I5032 Chronic diastolic (congestive) heart failure: Secondary | ICD-10-CM | POA: Diagnosis not present

## 2022-02-28 DIAGNOSIS — I160 Hypertensive urgency: Secondary | ICD-10-CM

## 2022-02-28 DIAGNOSIS — R4182 Altered mental status, unspecified: Secondary | ICD-10-CM | POA: Diagnosis present

## 2022-02-28 DIAGNOSIS — D72825 Bandemia: Secondary | ICD-10-CM

## 2022-02-28 DIAGNOSIS — Z789 Other specified health status: Secondary | ICD-10-CM

## 2022-02-28 DIAGNOSIS — I130 Hypertensive heart and chronic kidney disease with heart failure and stage 1 through stage 4 chronic kidney disease, or unspecified chronic kidney disease: Secondary | ICD-10-CM | POA: Insufficient documentation

## 2022-02-28 DIAGNOSIS — Z20822 Contact with and (suspected) exposure to covid-19: Secondary | ICD-10-CM | POA: Insufficient documentation

## 2022-02-28 DIAGNOSIS — Z79899 Other long term (current) drug therapy: Secondary | ICD-10-CM | POA: Diagnosis not present

## 2022-02-28 LAB — ECHOCARDIOGRAM COMPLETE
AR max vel: 3.59 cm2
AV Area VTI: 3.51 cm2
AV Area mean vel: 3.38 cm2
AV Mean grad: 3 mmHg
AV Peak grad: 5.5 mmHg
Ao pk vel: 1.17 m/s
Area-P 1/2: 2.95 cm2
Height: 74 in
S' Lateral: 3.1 cm
Weight: 3047.64 oz

## 2022-02-28 LAB — DIFFERENTIAL
Abs Immature Granulocytes: 0.04 10*3/uL (ref 0.00–0.07)
Basophils Absolute: 0.1 10*3/uL (ref 0.0–0.1)
Basophils Relative: 1 %
Eosinophils Absolute: 0.2 10*3/uL (ref 0.0–0.5)
Eosinophils Relative: 2 %
Immature Granulocytes: 0 %
Lymphocytes Relative: 14 %
Lymphs Abs: 1.5 10*3/uL (ref 0.7–4.0)
Monocytes Absolute: 0.7 10*3/uL (ref 0.1–1.0)
Monocytes Relative: 6 %
Neutro Abs: 8.6 10*3/uL — ABNORMAL HIGH (ref 1.7–7.7)
Neutrophils Relative %: 77 %

## 2022-02-28 LAB — CBC
HCT: 41.3 % (ref 39.0–52.0)
HCT: 41 % (ref 39.0–52.0)
Hemoglobin: 13.3 g/dL (ref 13.0–17.0)
Hemoglobin: 13.1 g/dL (ref 13.0–17.0)
MCH: 27 pg (ref 26.0–34.0)
MCH: 27 pg (ref 26.0–34.0)
MCHC: 32.2 g/dL (ref 30.0–36.0)
MCHC: 32 g/dL (ref 30.0–36.0)
MCV: 83.8 fL (ref 80.0–100.0)
MCV: 84.5 fL (ref 80.0–100.0)
Platelets: 187 10*3/uL (ref 150–400)
Platelets: 174 10*3/uL (ref 150–400)
RBC: 4.93 MIL/uL (ref 4.22–5.81)
RBC: 4.85 MIL/uL (ref 4.22–5.81)
RDW: 14 % (ref 11.5–15.5)
RDW: 13.9 % (ref 11.5–15.5)
WBC: 8.4 10*3/uL (ref 4.0–10.5)
WBC: 11.1 10*3/uL — ABNORMAL HIGH (ref 4.0–10.5)
nRBC: 0 % (ref 0.0–0.2)
nRBC: 0 % (ref 0.0–0.2)

## 2022-02-28 LAB — COMPREHENSIVE METABOLIC PANEL
ALT: 19 U/L (ref 0–44)
AST: 14 U/L — ABNORMAL LOW (ref 15–41)
Albumin: 4 g/dL (ref 3.5–5.0)
Alkaline Phosphatase: 111 U/L (ref 38–126)
Anion gap: 10 (ref 5–15)
BUN: 37 mg/dL — ABNORMAL HIGH (ref 8–23)
CO2: 24 mmol/L (ref 22–32)
Calcium: 9.4 mg/dL (ref 8.9–10.3)
Chloride: 105 mmol/L (ref 98–111)
Creatinine, Ser: 2.68 mg/dL — ABNORMAL HIGH (ref 0.61–1.24)
GFR, Estimated: 25 mL/min — ABNORMAL LOW (ref >60)
Glucose, Bld: 151 mg/dL — ABNORMAL HIGH (ref 70–99)
Potassium: 4.3 mmol/L (ref 3.5–5.1)
Sodium: 139 mmol/L (ref 135–145)
Total Bilirubin: 0.7 mg/dL (ref 0.3–1.2)
Total Protein: 7.2 g/dL (ref 6.5–8.1)

## 2022-02-28 LAB — RAPID URINE DRUG SCREEN, HOSP PERFORMED
Amphetamines: NOT DETECTED
Barbiturates: NOT DETECTED
Benzodiazepines: NOT DETECTED
Cocaine: NOT DETECTED
Opiates: NOT DETECTED
Tetrahydrocannabinol: NOT DETECTED

## 2022-02-28 LAB — BRAIN NATRIURETIC PEPTIDE: B Natriuretic Peptide: 362.8 pg/mL — ABNORMAL HIGH (ref 0.0–100.0)

## 2022-02-28 LAB — LIPID PANEL
Cholesterol: 175 mg/dL (ref 0–200)
HDL: 57 mg/dL (ref >40)
LDL Cholesterol: 106 mg/dL — ABNORMAL HIGH (ref 0–99)
Total CHOL/HDL Ratio: 3.1 RATIO
Triglycerides: 62 mg/dL (ref <150)
VLDL: 12 mg/dL (ref 0–40)

## 2022-02-28 LAB — URINALYSIS, ROUTINE W REFLEX MICROSCOPIC
Bilirubin Urine: NEGATIVE
Glucose, UA: NEGATIVE mg/dL
Ketones, ur: NEGATIVE mg/dL
Leukocytes,Ua: NEGATIVE
Nitrite: NEGATIVE
Protein, ur: >=300 mg/dL — AB
Specific Gravity, Urine: 1.011 (ref 1.005–1.030)
pH: 6 (ref 5.0–8.0)

## 2022-02-28 LAB — HEMOGLOBIN A1C
Hgb A1c MFr Bld: 6.7 % — ABNORMAL HIGH (ref 4.8–5.6)
Mean Plasma Glucose: 145.59 mg/dL

## 2022-02-28 LAB — ETHANOL: Alcohol, Ethyl (B): <10 mg/dL (ref <10)

## 2022-02-28 LAB — CBG MONITORING, ED
Glucose-Capillary: 138 mg/dL — ABNORMAL HIGH (ref 70–99)
Glucose-Capillary: 108 mg/dL — ABNORMAL HIGH (ref 70–99)
Glucose-Capillary: 148 mg/dL — ABNORMAL HIGH (ref 70–99)

## 2022-02-28 LAB — RESP PANEL BY RT-PCR (FLU A&B, COVID) ARPGX2
Influenza A by PCR: NEGATIVE
Influenza B by PCR: NEGATIVE
SARS Coronavirus 2 by RT PCR: NEGATIVE

## 2022-02-28 MED ORDER — STROKE: EARLY STAGES OF RECOVERY BOOK: Freq: Once | Status: DC

## 2022-02-28 MED ORDER — ROSUVASTATIN CALCIUM 10 MG PO TABS: 10.0000 mg | ORAL_TABLET | Freq: Every day | ORAL | 3 refills | Status: AC

## 2022-02-28 MED ORDER — ACETAMINOPHEN 325 MG PO TABS: 650.0000 mg | ORAL_TABLET | ORAL | Status: DC | PRN

## 2022-02-28 MED ORDER — ASPIRIN 325 MG PO TABS: 325.0000 mg | ORAL_TABLET | Freq: Every day | ORAL | Status: DC

## 2022-02-28 MED ORDER — ASPIRIN 81 MG PO TBEC: 81.0000 mg | DELAYED_RELEASE_TABLET | Freq: Every day | ORAL | Status: DC

## 2022-02-28 MED ORDER — PRAVASTATIN SODIUM 40 MG PO TABS
40.0000 mg | ORAL_TABLET | Freq: Every day | ORAL | Status: DC
  Administered 2022-02-28: 40 mg via ORAL
  Filled 2022-02-28: qty 1

## 2022-02-28 MED ORDER — ACETAMINOPHEN 160 MG/5ML PO SOLN: 650.0000 mg | ORAL | Status: DC | PRN

## 2022-02-28 MED ORDER — AMLODIPINE BESYLATE 5 MG PO TABS
10.0000 mg | ORAL_TABLET | Freq: Every day | ORAL | Status: DC
  Administered 2022-02-28: 10 mg via ORAL
  Filled 2022-02-28: qty 2

## 2022-02-28 MED ORDER — ASPIRIN 81 MG PO TBEC: 81.0000 mg | DELAYED_RELEASE_TABLET | Freq: Every day | ORAL | Status: AC

## 2022-02-28 MED ORDER — CLOPIDOGREL BISULFATE 75 MG PO TABS: 75.0000 mg | ORAL_TABLET | Freq: Every day | ORAL | 3 refills | Status: AC

## 2022-02-28 MED ORDER — LABETALOL HCL 5 MG/ML IV SOLN: 10.0000 mg | INTRAVENOUS | Status: DC | PRN

## 2022-02-28 MED ORDER — LABETALOL HCL 5 MG/ML IV SOLN
20.0000 mg | Freq: Once | INTRAVENOUS | Status: AC
  Administered 2022-02-28: 20 mg via INTRAVENOUS
  Filled 2022-02-28: qty 4

## 2022-02-28 MED ORDER — ENOXAPARIN SODIUM 40 MG/0.4ML IJ SOSY
40.0000 mg | PREFILLED_SYRINGE | INTRAMUSCULAR | Status: DC
  Administered 2022-02-28: 40 mg via SUBCUTANEOUS
  Filled 2022-02-28: qty 0.4

## 2022-02-28 MED ORDER — ACETAMINOPHEN 650 MG RE SUPP: 650.0000 mg | RECTAL | Status: DC | PRN

## 2022-02-28 MED ORDER — INSULIN ASPART 100 UNIT/ML IJ SOLN: 0.0000 [IU] | Freq: Every day | INTRAMUSCULAR | Status: DC

## 2022-02-28 MED ORDER — CLOPIDOGREL BISULFATE 75 MG PO TABS
75.0000 mg | ORAL_TABLET | Freq: Every day | ORAL | Status: DC
  Administered 2022-02-28: 75 mg via ORAL
  Filled 2022-02-28: qty 1

## 2022-02-28 MED ORDER — INSULIN ASPART 100 UNIT/ML IJ SOLN: 0.0000 [IU] | Freq: Three times a day (TID) | INTRAMUSCULAR | Status: DC

## 2022-02-28 NOTE — ED Provider Notes (Signed)
[facility] EMERGENCY DEPARTMENT Provider Note   CSN: [id number] Arrival date & time: 02/28/22  0105     History  Chief Complaint  Patient presents with   Altered Mental Status    Gabriel Fowler is a 66 y.o. male.  The history is provided by a relative and the patient. A language interpreter was used.  Altered Mental Status He has history of hypertension, diabetes, hyperlipidemia, chronic kidney disease, chronic diastolic heart failure, transient ischemic attack and is brought in by family because of speech difficulty.  Family states that he was mumbling and they could not understand what he was saying and he could not understand what they were saying to him.  He also stated that his right hand felt heavy at that time.  His hand is back to normal and his speech is almost back to normal.  Symptoms started about 3 hours ago.  He is also complaining of a left-sided headache.  Blood pressure was checked at home and was over 294 systolic.  His usual blood pressure at home is around 765 systolic.   Home Medications Prior to Admission medications   Medication Sig Start Date End Date Taking? Authorizing Provider  amLODipine (NORVASC) 10 MG tablet Take 10 mg by mouth daily.    [provider]  Cholecalciferol (VITAMIN D3) 50 MCG (2000 UT) TABS Take 2,000 Units by mouth daily.    [provider]  clopidogrel (PLAVIX) 75 MG tablet Take 1 tablet (75 mg total) by mouth daily. Patient not taking: Reported on 10/28/2021 09/04/15   Barton Dubois, MD  gabapentin (NEURONTIN) 100 MG capsule Take 100 mg by mouth 3 (three) times daily. 02/15/21   [provider]  glipiZIDE (GLUCOTROL) 5 MG tablet Take 5 mg by mouth 2 (two) times daily. 02/23/21   [provider]  isosorbide mononitrate (IMDUR) 30 MG 24 hr tablet Take 2 tablets (60 mg total) by mouth daily. Patient not taking: Reported on 10/28/2021 03/05/21   Charlynne Cousins, MD  pravastatin  (PRAVACHOL) 40 MG tablet Take 1 tablet (40 mg total) by mouth daily. Patient not taking: Reported on 03/02/2021 09/04/15   Barton Dubois, MD  zinc gluconate 50 MG tablet Take 50 mg by mouth daily.    [provider]      Allergies    Patient has no known allergies.    Review of Systems   Review of Systems  All other systems reviewed and are negative.   Physical Exam Updated Vital Signs BP (!) 233/99   Pulse (!) 59   Temp 98.1 F (36.7 C) (Oral)   Resp 18   Ht 6\' 2"  (1.88 m)   Wt 86.4 kg   SpO2 100%   BMI 24.46 kg/m  Physical Exam Vitals and nursing note reviewed.   66 year old male, resting comfortably and in no acute distress. Vital signs are significant for markedly elevated blood pressure. Oxygen saturation is 100%, which is normal. Head is normocephalic and atraumatic. PERRLA, EOMI. Oropharynx is clear.  Fundi show no hemorrhage or exudate. Neck is nontender and supple without adenopathy or JVD. Back is nontender and there is no CVA tenderness. Lungs are clear without rales, wheezes, or rhonchi. Chest is nontender. Heart has regular rate and rhythm without murmur. Abdomen is soft, flat, nontender without masses or hepatosplenomegaly and peristalsis is normoactive. Extremities have no cyanosis or edema, full range of motion is present. Skin is warm and dry without rash. Neurologic: Awake and alert, cooperative,  cranial nerves are intact, strength is 5/5 in all 4 extremities, there is no pronator drift, there is no extinction on double simultaneous stimulation.  ED Results / Procedures / Treatments   Labs (all labs ordered are listed, but only abnormal results are displayed) Labs Reviewed  COMPREHENSIVE METABOLIC PANEL - Abnormal; Notable for the following components:      Result Value   Glucose, Bld 151 (*)    BUN 37 (*)    Creatinine, Ser 2.68 (*)    AST 14 (*)    GFR, Estimated 25 (*)    All other components within normal limits  URINALYSIS, ROUTINE W  REFLEX MICROSCOPIC - Abnormal; Notable for the following components:   Hgb urine dipstick SMALL (*)    Protein, ur >=300 (*)    Bacteria, UA RARE (*)    All other components within normal limits  DIFFERENTIAL - Abnormal; Notable for the following components:   Neutro Abs 8.6 (*)    All other components within normal limits  CBC - Abnormal; Notable for the following components:   WBC 11.1 (*)    All other components within normal limits  CBG MONITORING, ED - Abnormal; Notable for the following components:   Glucose-Capillary 138 (*)    All other components within normal limits  RESP PANEL BY RT-PCR (FLU A&B, COVID) ARPGX2  CBC  ETHANOL  RAPID URINE DRUG SCREEN, HOSP PERFORMED  PROTIME-INR  APTT  HEMOGLOBIN A1C  HEMOGLOBIN A1C  BRAIN NATRIURETIC PEPTIDE    EKG EKG Interpretation  Date/Time:  Monday February 28 2022 01:13:00 EDT Ventricular Rate:  59 PR Interval:  178 QRS Duration: 78 QT Interval:  412 QTC Calculation: 407 R Axis:   62 Text Interpretation: Sinus bradycardia Nonspecific ST abnormality Abnormal ECG When compared with ECG of 28-Oct-2021 19:14, No significant change was found Confirmed by Delora Fuel ([id number]) on 02/28/2022 2:11:21 AM  Radiology CT HEAD WO CONTRAST  Result Date: 02/28/2022 CLINICAL DATA:  Increasing confusion EXAM: CT HEAD WITHOUT CONTRAST TECHNIQUE: Contiguous axial images were obtained from the base of the skull through the vertex without intravenous contrast. RADIATION DOSE REDUCTION: This exam was performed according to the departmental dose-optimization program which includes automated exposure control, adjustment of the mA and/or kV according to patient size and/or use of iterative reconstruction technique. COMPARISON:  03/01/2021 FINDINGS: Brain: No evidence of acute infarction, hemorrhage, hydrocephalus, extra-axial collection or mass lesion/mass effect. Mild atrophic changes are noted. Vascular: No hyperdense vessel or unexpected  calcification. Skull: Normal. Negative for fracture or focal lesion. Sinuses/Orbits: No acute finding. Other: None. IMPRESSION: Mild atrophic changes without acute abnormality. Electronically Signed   By: Inez Catalina M.D.   On: 02/28/2022 02:49    Procedures Procedures  Cardiac monitor shows normal sinus rhythm, per my interpretation.  Medications Ordered in ED Medications  labetalol (NORMODYNE) injection 10 mg (has no administration in time range)   stroke: early stages of recovery book (has no administration in time range)  acetaminophen (TYLENOL) tablet 650 mg (has no administration in time range)    Or  acetaminophen (TYLENOL) 160 MG/5ML solution 650 mg (has no administration in time range)    Or  acetaminophen (TYLENOL) suppository 650 mg (has no administration in time range)  enoxaparin (LOVENOX) injection 40 mg (has no administration in time range)  insulin aspart (novoLOG) injection 0-9 Units (has no administration in time range)  insulin aspart (novoLOG) injection 0-5 Units (has no administration in time range)  labetalol (NORMODYNE) injection 20 mg (20  mg Intravenous Given 02/28/22 0301)  labetalol (NORMODYNE) injection 20 mg (20 mg Intravenous Given 02/28/22 0445)    ED Course/ Medical Decision Making/ A&P                           Medical Decision Making Amount and/or Complexity of Data Reviewed Labs: ordered. Radiology: ordered.  Risk Prescription drug management. Decision regarding hospitalization.   Transient speech disturbance and right hand weakness consistent with transient ischemic attack.  No persistent deficits to suggest stroke.  Markedly elevated blood pressure, possible hypertensive urgency.  Doubt intracranial bleeding or tumor.  I have reviewed his old records, and he was admitted on 09/03/2015 for strokelike symptoms with eventual diagnosis of transient ischemic attack.  Since it has been 6 years since his last TIA work-up, I feel that he will need to be  admitted for repeat studies.  I have ordered CT of the head without contrast.  I have ordered intravenous labetalol for his blood pressure.  Blood pressure has come down with labetalol, but is still significantly elevated.  I have ordered a second dose of labetalol.  CT of head shows no acute process.  I have independently viewed the images, and agree with the radiologist's interpretation.  I have reviewed and interpreted his electrocardiogram, and my interpretation is sinus bradycardia with nonspecific ST changes unchanged from prior.  I have reviewed and interpreted his laboratory test, and my interpretation is renal insufficiency which is improved over baseline.  Normal CBC.  I have discussed the case with Dr. Marlowe Sax of [facility], who agrees to admit the patient.  Case is also discussed with Dr. Lorrin Goodell of neurology service who agrees to see the patient in consultation.  MRI of the brain and MRA of the head have been ordered.  Final Clinical Impression(s) / ED Diagnoses Final diagnoses:  TIA (transient ischemic attack)  Hypertensive urgency  Renal insufficiency    Rx / DC Orders ED Discharge Orders     None         Delora Fuel, MD 09/81/19 [phone]

## 2022-02-28 NOTE — Evaluation (Signed)
Occupational Therapy Evaluation Patient Details Name: Gabriel Fowler MRN: [id number] DOB: Feb 13, 1956 Today's Date: 02/28/2022   History of Present Illness 66 yo male presenting to ED on 9/18 with R hand weakness and aphasia. MRI negative for acute change. PMH including DM2, HTN, HLD, prior stroke with no residual deficit.   Clinical Impression   PTA, pt was living with his wife and two daughters and was independent. Currently, pt performing Supervision for ADLs and functional mobility using SPC. Pt presenting near baseline for balance, strength, coordination, and cognition. Video interpreter used throughout. VSS. Answered all pt questions. Recommend dc home once medically stable per physician. All acute OT needs met and will sign off. Thank you.    Recommendations for follow up therapy are one component of a multi-disciplinary discharge planning process, led by the attending physician.  Recommendations may be updated based on patient status, additional functional criteria and insurance authorization.   Follow Up Recommendations  No OT follow up    Assistance Recommended at Discharge PRN  Patient can return home with the following      Functional Status Assessment  Patient has had a recent decline in their functional status and demonstrates the ability to make significant improvements in function in a reasonable and predictable amount of time.  Equipment Recommendations  None recommended by OT    Recommendations for Other Services       Precautions / Restrictions Precautions Precautions: Fall Restrictions Weight Bearing Restrictions: No      Mobility Bed Mobility Overal bed mobility: Modified Independent                  Transfers Overall transfer level: Needs assistance Equipment used: Straight cane Transfers: Sit to/from Stand Sit to Stand: Supervision           General transfer comment: safety      Balance Overall balance assessment: No apparent balance  deficits (not formally assessed)                                         ADL either performed or assessed with clinical judgement   ADL Overall ADL's : Needs assistance/impaired                                       General ADL Comments: Pt performing ADLs at supervision level. Pt demonstrating baseline strength, balance, coordiantion, and cognition.     Vision Baseline Vision/History: 1 Wears glasses (reading) Additional Comments: no changes     Perception     Praxis      Pertinent Vitals/Pain       Hand Dominance Right   Extremity/Trunk Assessment Upper Extremity Assessment Upper Extremity Assessment: Overall WFL for tasks assessed   Lower Extremity Assessment Lower Extremity Assessment: Overall WFL for tasks assessed   Cervical / Trunk Assessment Cervical / Trunk Assessment: Kyphotic (lower back pain)   Communication Communication Communication: Interpreter utilized (ID #[id number])   Cognition                                             General Comments  Daughter arriving at end of session. VSS throughout    Exercises     Shoulder  Instructions      Home Living Family/patient expects to be discharged to:: Private residence Living Arrangements: Spouse/significant other;Children (wife and 2 dtrs (59 and 15 yo)) Available Help at Discharge: Family Type of Home: House Home Access: Stairs to enter Technical brewer of Steps: 2   [place]: Two level;Able to live on main level with bedroom/bathroom     Bathroom Shower/Tub: Teacher, early years/pre: Standard     Home Equipment: Marine scientist - single point   Additional Comments: Wears glasses reading      Prior Functioning/Environment Prior Level of Function : Independent/Modified Independent             Mobility Comments: Walks with cane; can walk in community; walks 2 hr day for exercise ADLs Comments: sits for showers         OT Problem List: Decreased activity tolerance;Decreased knowledge of use of DME or AE;Decreased knowledge of precautions      OT Treatment/Interventions:      OT Goals(Current goals can be found in the care plan section) Acute Rehab OT Goals Patient Stated Goal: Go home OT Goal Formulation: All assessment and education complete, DC therapy  OT Frequency:      Co-evaluation PT/OT/SLP Co-Evaluation/Treatment: Yes Reason for Co-Treatment: For patient/therapist safety;To address functional/ADL transfers   OT goals addressed during session: ADL's and self-care      AM-PAC OT "6 Clicks" Daily Activity     Outcome Measure Help from another person eating meals?: None Help from another person taking care of personal grooming?: None Help from another person toileting, which includes using toliet, bedpan, or urinal?: None Help from another person bathing (including washing, rinsing, drying)?: None Help from another person to put on and taking off regular upper body clothing?: None Help from another person to put on and taking off regular lower body clothing?: None 6 Click Score: 24   End of Session Equipment Utilized During Treatment: Gait belt Nurse Communication: Mobility status  Activity Tolerance: Patient tolerated treatment well Patient left: with call bell/phone within reach;Other (comment);in bed;with family/visitor present (with ECHO)  OT Visit Diagnosis: Other abnormalities of gait and mobility (R26.89);Muscle weakness (generalized) (M62.81)                Time: 1028-1050 OT Time Calculation (min): 22 min Charges:  OT General Charges $OT Visit: 1 Visit OT Evaluation $OT Eval Low Complexity: 1 Low  Ysidro Ramsay MSOT, OTR/L Acute Rehab Office: [place] 02/28/2022, 11:40 AM

## 2022-02-28 NOTE — Evaluation (Signed)
Physical Therapy Evaluation Patient Details Name: Gabriel Fowler MRN: [id number] DOB: 1955-11-06 Today's Date: 02/28/2022  History of Present Illness  66 yo male presenting to ED on 9/18 with R hand weakness and aphasia. MRI negative for acute change. PMH including DM2, HTN, HLD, prior stroke with no residual deficit.  Clinical Impression  Pt admitted with above diagnosis. At baseline, pt is ambulatory with a cane.  Today, pt ambulating 200' with a cane and supervision.  Demonstrated equal strength bilaterally and mild unsteadiness with gait challenges.  Pt currently with functional limitations due to the deficits listed below (see PT Problem List). Pt will benefit from skilled PT to increase their independence and safety with mobility to allow discharge to the venue listed below.          Recommendations for follow up therapy are one component of a multi-disciplinary discharge planning process, led by the attending physician.  Recommendations may be updated based on patient status, additional functional criteria and insurance authorization.  Follow Up Recommendations No PT follow up      Assistance Recommended at Discharge PRN  Patient can return home with the following  Assistance with cooking/housework;Help with stairs or ramp for entrance    Equipment Recommendations None recommended by PT  Recommendations for Other Services       Functional Status Assessment Patient has had a recent decline in their functional status and demonstrates the ability to make significant improvements in function in a reasonable and predictable amount of time.     Precautions / Restrictions Precautions Precautions: Fall Restrictions Weight Bearing Restrictions: No      Mobility  Bed Mobility Overal bed mobility: Modified Independent                  Transfers Overall transfer level: Needs assistance Equipment used: Straight cane Transfers: Sit to/from Stand Sit to Stand:  Supervision           General transfer comment: safety    Ambulation/Gait Ambulation/Gait assistance: Min guard, Supervision Gait Distance (Feet): 200 Feet Assistive device: Straight cane Gait Pattern/deviations: Step-through pattern, Decreased stride length Gait velocity: decreased     General Gait Details: Min guard progressing to supervision; some drifting and slowing down with challenges but no overt LOB; daughter reports looks a little unsteady compared to baselin  Science writer    Modified Rankin (Stroke Patients Only)       Balance Overall balance assessment: Needs assistance Sitting-balance support: No upper extremity supported Sitting balance-Leahy Scale: Good     Standing balance support: Single extremity supported, No upper extremity supported Standing balance-Leahy Scale: Fair Standing balance comment: Cane to ambulate and with challenges               High Level Balance Comments: Pt slowed to avoid object, step over object, and with head turn.  Some drifting with head turns             Pertinent Vitals/Pain Pain Assessment Pain Assessment: No/denies pain    Home Living Family/patient expects to be discharged to:: Private residence Living Arrangements: Spouse/significant other;Children (wife and 2 dtrs (72 and 62 yo)) Available Help at Discharge: Family Type of Home: House Home Access: Stairs to enter   Technical brewer of Steps: 2   Home Layout: Two level;Able to live on main level with bedroom/bathroom Home Equipment: Shower seat;Cane - single point Additional Comments: Wears glasses reading    Prior Function  Prior Level of Function : Independent/Modified Independent             Mobility Comments: Walks with cane; can walk in community; walks 2 hr day for exercise ADLs Comments: sits for showers     Hand Dominance   Dominant Hand: Right    Extremity/Trunk Assessment   Upper Extremity  Assessment Upper Extremity Assessment: Defer to OT evaluation    Lower Extremity Assessment Lower Extremity Assessment: LLE deficits/detail;RLE deficits/detail RLE Deficits / Details: ROM WFL; MMT 5/5 RLE Sensation: WNL RLE Coordination: WNL LLE Deficits / Details: ROM WFL; MMT 5/5 LLE Sensation: WNL LLE Coordination: WNL    Cervical / Trunk Assessment Cervical / Trunk Assessment: Kyphotic  Communication   Communication: Interpreter utilized (ID #[id number])  Cognition Arousal/Alertness: Awake/alert Behavior During Therapy: WFL for tasks assessed/performed Overall Cognitive Status: Within Functional Limits for tasks assessed                                          General Comments General comments (skin integrity, edema, etc.): VSS    Exercises     Assessment/Plan    PT Assessment Patient needs continued PT services  PT Problem List Decreased mobility;Decreased balance;Decreased knowledge of use of DME       PT Treatment Interventions DME instruction;Therapeutic exercise;Gait training;Balance training;Neuromuscular re-education;Stair training;Functional mobility training;Therapeutic activities;Patient/family education    PT Goals (Current goals can be found in the Care Plan section)  Acute Rehab PT Goals Patient Stated Goal: return home PT Goal Formulation: With patient Time For Goal Achievement: 03/14/22 Potential to Achieve Goals: Good Additional Goals Additional Goal #1: Will score >19 on DGI to indicate low fall risk    Frequency Min 3X/week     Co-evaluation   Reason for Co-Treatment: For patient/therapist safety;To address functional/ADL transfers (in ED)   OT goals addressed during session: ADL's and self-care       AM-PAC PT "6 Clicks" Mobility  Outcome Measure Help needed turning from your back to your side while in a flat bed without using bedrails?: None Help needed moving from lying on your back to sitting on the side of a flat  bed without using bedrails?: None Help needed moving to and from a bed to a chair (including a wheelchair)?: A Little Help needed standing up from a chair using your arms (e.g., wheelchair or bedside chair)?: A Little Help needed to walk in hospital room?: A Little Help needed climbing 3-5 steps with a railing? : A Little 6 Click Score: 20    End of Session Equipment Utilized During Treatment: Gait belt Activity Tolerance: Patient tolerated treatment well Patient left: in bed;with call bell/phone within reach;with family/visitor present Nurse Communication: Mobility status PT Visit Diagnosis: Other abnormalities of gait and mobility (R26.89)    Time: 0454-0981 PT Time Calculation (min) (ACUTE ONLY): 20 min   Charges:   PT Evaluation $PT Eval Low Complexity: 1 Low          Ayonna Speranza, PT Acute Rehab [facility] [phone]   Karlton Lemon 02/28/2022, 1:25 PM

## 2022-02-28 NOTE — Discharge Summary (Signed)
Physician Discharge Summary   Patient: Gabriel Fowler MRN: [id number] DOB: [birth year]  Admit date:     02/28/2022  Discharge date: 02/28/22  Discharge Physician: Mercy Riding   PCP: Elwyn Reach, MD   Recommendations at discharge:   Reassess blood pressure and adjust antihypertensive meds as appropriate. Emphasized the importance of compliance with his medications  Discharge Diagnoses: Principal Problem:   TIA (transient ischemic attack) Active Problems:   Hypercholesteremia   Non-insulin dependent type 2 diabetes mellitus (HCC)   CKD (chronic kidney disease), stage IV (HCC)   Chronic diastolic CHF (congestive heart failure) (HCC)   Hypertensive urgency   Leukocytosis   Language barrier   Noncompliance  Resolved Problems:   Transient neurological symptoms  Hospital Course: 66 year old M with PMH of prior CVA without residual deficit, NIDDM-2, HTN and CKD-4 presenting with mumbling and heaviness in right arm that has resolved on arrival to ED. Patient stopped taking his blood pressure medications including amlodipine about a week prior "due to cough" in ED.  It seems he was on lisinopril at some point that was changed to losartan.  Unclear if he is still taking losartan or not.  He last saw PCP about 6 months ago.  [place] to call last week.  In ED, hypertensive to 233/99. Cr 2.6 (<baseline).  COVID and influenza PCR nonreactive.  Glucose 151.  WBC 11.1.  EtOH and UDS negative.  CTH without acute finding.  Neurology consulted.  CVA work-up ensued.  Patient remained asymptomatic.  Blood pressure improved.  MRI brain, MRA head, carotid ultrasound without significant finding.  TTE with normal LVEF and G1 DD.  A1c 6.7%.  LDL 106.  Cleared for discharge by neurology on DAPT with Plavix and aspirin for 3 weeks followed by Plavix alone.  Neurology also recommended changing pravastatin to Crestor based on LDL.  Patient was counseled on the  importance of compliance with medication.  Follow-up with PCP in 1 to 2 weeks.  Outpatient follow-up with neurology in 4 to 6 weeks.    Assessment and Plan: Principal Problem:   TIA (transient ischemic attack) Active Problems:   Hypercholesteremia   Non-insulin dependent type 2 diabetes mellitus (HCC)   CKD (chronic kidney disease), stage IV (HCC)   Chronic diastolic CHF (congestive heart failure) (HCC)   Hypertensive urgency   Leukocytosis   Language barrier   Noncompliance   Transient ischemic attack in patient with history of CVA-in the setting of uncontrolled hypertension.  CVA work-up negative.  No further neuro symptoms since arrival in the hospital.  -Discharged on Plavix and aspirin for 3 weeks followed by Plavix alone. -Changed pravastatin to Crestor 10 mg daily. -Discussed the importance of compliance with his medications -Outpatient follow-up with neurology in 4 to 6 weeks  Uncontrolled hypertension hypertensive urgency: Likely due to noncompliance with medication.  Patient reports cough with amlodipine.  I suspect his cough is likely from lisinopril.  It seems his lisinopril was changed to losartan.  Discontinued lisinopril from [facility] and added to allergy list.  BP improved. -Continue amlodipine.  Reassured that amlodipine does not cause cough. -Continue losartan.  Encouraged to discuss with PCP if he continues to have cough with losartan. -Discussed the importance of compliance with his medication.  Controlled NIDDM-2 with CKD-4 and HLD: A1c 6.7%.  LDL 106. -Continue home glipizide -Crestor 10 mg daily  CKD-4: Creatinine better than baseline. Recent Labs    03/01/21 2233 03/03/21  0246 03/04/21 0214 03/05/21 0140 02/28/22 0129  BUN 40* 61* 90* 90* 37*  CREATININE 3.29* 3.43* 3.68* 3.68* 2.68*  -Consider referral to nephrology  Mild leukocytosis: Likely demargination.  Low suspicion for infection.  Noncompliance -Counseled.  Language barrier -Video  interpreter with ID number [id number] utilized  Hyperlipidemia -Changed statin to high intensity    Consultants: Neurology Procedures performed: None Disposition: Home Diet recommendation:  Cardiac and Carb modified diet DISCHARGE MEDICATION: Allergies as of 02/28/2022       Reactions   Lisinopril Cough        Medication List     STOP taking these medications    lisinopril 10 MG tablet Commonly known as: ZESTRIL   pravastatin 40 MG tablet Commonly known as: Pravachol       TAKE these medications    aspirin EC 81 MG tablet Take 1 tablet (81 mg total) by mouth daily for 21 days. Swallow whole. Start taking on: March 01, 2022   clopidogrel 75 MG tablet Commonly known as: Plavix Take 1 tablet (75 mg total) by mouth daily.   glipiZIDE 5 MG tablet Commonly known as: GLUCOTROL Take 5 mg by mouth 2 (two) times daily.   IRON PO Take 1 capsule by mouth daily.   losartan 50 MG tablet Commonly known as: COZAAR Take 50 mg by mouth daily.   Norvasc 10 MG tablet Generic drug: amLODipine Take 10 mg by mouth daily.   rosuvastatin 10 MG tablet Commonly known as: Crestor Take 1 tablet (10 mg total) by mouth daily.   sodium bicarbonate 650 MG tablet Take 650 mg by mouth 2 (two) times daily.   Vitamin D3 50 MCG (2000 UT) Tabs Take 2,000 Units by mouth daily.        Follow-up Information     Elwyn Reach, MD. Schedule an appointment as soon as possible for a visit in 1 week(s).   Specialty: Internal Medicine Contact information: [address] [place] [phone]         [facility]. Schedule an appointment as soon as possible for a visit in 5 week(s).   Specialty: Neurology Contact information: [address] [place] [phone]               Discharge Exam: Danley Danker Weights   02/28/22 0120  Weight: 86.4 kg   Vitals:   02/28/22 1100 02/28/22 1145 02/28/22 1211  02/28/22 1230  BP: (!) 156/77 (!) 156/85  (!) 158/86  Pulse: 64 68  66  Resp: 17 19  18   Temp:   98.1 F (36.7 C)   TempSrc:   Oral   SpO2: 99% 100%  100%  Weight:      Height:        GENERAL: No apparent distress.  Nontoxic. HEENT: MMM.  Vision and hearing grossly intact.  NECK: Supple.  No apparent JVD.  RESP:  No IWOB.  Fair aeration bilaterally. CVS:  RRR. Heart sounds normal.  ABD/GI/GU: BS+. Abd soft, NTND.  MSK/EXT:  Moves extremities. No apparent deformity. No edema.  SKIN: no apparent skin lesion or wound NEURO: Awake and alert. Oriented appropriately.  No apparent focal neuro deficit. PSYCH: Calm. Normal affect.   Condition at discharge: good  The results of significant diagnostics from this hospitalization (including imaging, microbiology, ancillary and laboratory) are listed below for reference.   Imaging Studies: ECHOCARDIOGRAM COMPLETE  Result Date: 02/28/2022    ECHOCARDIOGRAM REPORT   Patient Name:  Gabriel Fowler Date of Exam: 02/28/2022 Medical Rec #:  [id number]       Height:       74.0 in Accession #:    [id number]      Weight:       190.5 lb Date of Birth:  03-02-1956        BSA:          2.129 m Patient Age:    43 years        BP:           185/87 mmHg Patient Gender: M               HR:           64 bpm. Exam Location:  Inpatient Procedure: 2D Echo, Color Doppler and Cardiac Doppler Indications:    TIA  History:        Patient has prior history of Echocardiogram examinations, most                 recent 09/04/2015. Risk Factors:Hypertension, Dyslipidemia and                 Diabetes.  Sonographer:    Memory Argue Referring Phys: [id number] [place]  1. Left ventricular ejection fraction, by estimation, is 60 to 65%. The left ventricle has normal function. The left ventricle has no regional wall motion abnormalities. Left ventricular diastolic parameters are consistent with Grade I diastolic dysfunction (impaired relaxation).  2. Right ventricular  systolic function is normal. The right ventricular size is normal. Tricuspid regurgitation signal is inadequate for assessing PA pressure.  3. The mitral valve is normal in structure. No evidence of mitral valve regurgitation. No evidence of mitral stenosis.  4. The aortic valve was not well visualized. Aortic valve regurgitation is not visualized. Aortic valve sclerosis/calcification is present, without any evidence of aortic stenosis. Aortic valve area, by VTI measures 3.51 cm. Aortic valve mean gradient measures 3.0 mmHg. Aortic valve Vmax measures 1.17 m/s. Conclusion(s)/Recommendation(s): No intracardiac source of embolism detected on this transthoracic study. Consider a transesophageal echocardiogram to exclude cardiac source of embolism if clinically indicated. FINDINGS  Left Ventricle: Left ventricular ejection fraction, by estimation, is 60 to 65%. The left ventricle has normal function. The left ventricle has no regional wall motion abnormalities. The left ventricular internal cavity size was normal in size. There is  no left ventricular hypertrophy. Left ventricular diastolic parameters are consistent with Grade I diastolic dysfunction (impaired relaxation). Normal left ventricular filling pressure. Right Ventricle: The right ventricular size is normal. No increase in right ventricular wall thickness. Right ventricular systolic function is normal. Tricuspid regurgitation signal is inadequate for assessing PA pressure. Left Atrium: Left atrial size was normal in size. Right Atrium: Right atrial size was normal in size. Pericardium: There is no evidence of pericardial effusion. Mitral Valve: The mitral valve is normal in structure. No evidence of mitral valve regurgitation. No evidence of mitral valve stenosis. Tricuspid Valve: The tricuspid valve is normal in structure. Tricuspid valve regurgitation is not demonstrated. No evidence of tricuspid stenosis. Aortic Valve: The aortic valve was not well  visualized. Aortic valve regurgitation is not visualized. Aortic valve sclerosis/calcification is present, without any evidence of aortic stenosis. Aortic valve mean gradient measures 3.0 mmHg. Aortic valve peak gradient measures 5.5 mmHg. Aortic valve area, by VTI measures 3.51 cm. Pulmonic Valve: The pulmonic valve was normal in structure. Pulmonic valve regurgitation is not visualized. No evidence of pulmonic stenosis. Aorta: The  aortic root is normal in size and structure. IAS/Shunts: No atrial level shunt detected by color flow Doppler.  LEFT VENTRICLE PLAX 2D LVIDd:         4.70 cm   Diastology LVIDs:         3.10 cm   LV e' medial:    5.87 cm/s LV PW:         1.20 cm   LV E/e' medial:  13.1 LV IVS:        1.00 cm   LV e' lateral:   8.81 cm/s LVOT diam:     2.30 cm   LV E/e' lateral: 8.7 LV SV:         88 LV SV Index:   41 LVOT Area:     4.15 cm  RIGHT VENTRICLE TAPSE (M-mode): 2.0 cm LEFT ATRIUM             Index        RIGHT ATRIUM           Index LA Vol (A2C):   34.0 ml 15.97 ml/m  RA Area:     10.00 cm LA Vol (A4C):   52.5 ml 24.66 ml/m  RA Volume:   19.70 ml  9.25 ml/m LA Biplane Vol: 43.0 ml 20.20 ml/m  AORTIC VALVE AV Area (Vmax):    3.59 cm AV Area (Vmean):   3.38 cm AV Area (VTI):     3.51 cm AV Vmax:           117.00 cm/s AV Vmean:          79.800 cm/s AV VTI:            0.251 m AV Peak Grad:      5.5 mmHg AV Mean Grad:      3.0 mmHg LVOT Vmax:         101.00 cm/s LVOT Vmean:        65.000 cm/s LVOT VTI:          0.212 m LVOT/AV VTI ratio: 0.84  AORTA Ao Root diam: 3.20 cm MITRAL VALVE MV Area (PHT): 2.95 cm     SHUNTS MV Decel Time: 257 msec     Systemic VTI:  0.21 m MV E velocity: 77.00 cm/s   Systemic Diam: 2.30 cm MV A velocity: 108.00 cm/s MV E/A ratio:  0.71 Fransico Him MD Electronically signed by Fransico Him MD Signature Date/Time: 02/28/2022/11:28:45 AM    Final    VAS US CAROTID  Result Date: 02/28/2022 Carotid Arterial Duplex Study Patient Name:  Gabriel Fowler  Date of  Exam:   02/28/2022 Medical Rec #: [id number]        Accession #:    [id number] Date of Birth: 02-Aug-1955         Patient Gender: M Patient Age:   65 years Exam Location:  [facility] Procedure:      VAS US CAROTID Referring Phys: Bretta Bang Story Conti --------------------------------------------------------------------------------  Indications:       TIA. Risk Factors:      Hypertension, hyperlipidemia, Diabetes. Comparison Study:  09-04-2015 Prior carotid duplex for TIA. Performing Technologist: Darlin Coco RDMS, RVT  Examination Guidelines: A complete evaluation includes B-mode imaging, spectral Doppler, color Doppler, and power Doppler as needed of all accessible portions of each vessel. Bilateral testing is considered an integral part of a complete examination. Limited examinations for reoccurring indications may be performed as noted.  Right Carotid Findings: +----------+--------+--------+--------+------------------------------+--------+  PSV cm/sEDV cm/sStenosisPlaque Description            Comments +----------+--------+--------+--------+------------------------------+--------+ CCA Prox  96      11                                                     +----------+--------+--------+--------+------------------------------+--------+ CCA Distal65      15              focal, smooth and heterogenous         +----------+--------+--------+--------+------------------------------+--------+ ICA Prox  74      21                                                     +----------+--------+--------+--------+------------------------------+--------+ ICA Mid   107     29                                                     +----------+--------+--------+--------+------------------------------+--------+ ICA Distal116     31                                                     +----------+--------+--------+--------+------------------------------+--------+ ECA       98      20                                                      +----------+--------+--------+--------+------------------------------+--------+ +----------+--------+-------+----------------+-------------------+           PSV cm/sEDV cmsDescribe        Arm Pressure (mmHG) +----------+--------+-------+----------------+-------------------+ [id number]            Multiphasic, WNL                    +----------+--------+-------+----------------+-------------------+ +---------+--------+--+--------+--+---------+ VertebralPSV cm/s58EDV cm/s17Antegrade +---------+--------+--+--------+--+---------+  Left Carotid Findings: +----------+--------+--------+--------+------------------+------------------+           PSV cm/sEDV cm/sStenosisPlaque DescriptionComments           +----------+--------+--------+--------+------------------+------------------+ CCA Prox  134     21                                                   +----------+--------+--------+--------+------------------+------------------+ CCA Distal78      19                                intimal thickening +----------+--------+--------+--------+------------------+------------------+ ICA Prox  61      19                                                   +----------+--------+--------+--------+------------------+------------------+  ICA Mid   96      30                                                   +----------+--------+--------+--------+------------------+------------------+ ICA Distal105     36                                                   +----------+--------+--------+--------+------------------+------------------+ ECA       109     14                                                   +----------+--------+--------+--------+------------------+------------------+ +----------+--------+--------+----------------+-------------------+           PSV cm/sEDV cm/sDescribe        Arm Pressure (mmHG)  +----------+--------+--------+----------------+-------------------+ [id number]             Multiphasic, WNL                    +----------+--------+--------+----------------+-------------------+ +---------+--------+--+--------+--+---------+ VertebralPSV cm/s60EDV cm/s15Antegrade +---------+--------+--+--------+--+---------+   Summary: Right Carotid: The extracranial vessels were near-normal with only minimal wall                thickening or plaque. Left Carotid: The extracranial vessels were near-normal with only minimal wall               thickening or plaque. Vertebrals:  Bilateral vertebral arteries demonstrate antegrade flow. Subclavians: Normal flow hemodynamics were seen in bilateral subclavian              arteries. *See table(s) above for measurements and observations.     Preliminary    MR BRAIN WO CONTRAST  Result Date: 02/28/2022 CLINICAL DATA:  Transient ischemic attack.  Confusion yesterday. EXAM: MRI HEAD WITHOUT CONTRAST MRA HEAD WITHOUT CONTRAST TECHNIQUE: Multiplanar, multi-echo pulse sequences of the brain and surrounding structures were acquired without intravenous contrast. Angiographic images of the Circle of Willis were acquired using MRA technique without intravenous contrast. COMPARISON:  Head CT same day.  MRI 06/10/2020 FINDINGS: MRI HEAD FINDINGS Brain: Diffusion imaging does not show any acute or subacute infarction. Chronic small-vessel ischemic changes affect the pons. No focal cerebellar insult. Cerebral hemispheres show mild chronic small-vessel ischemic change of the white matter but no cortical or large vessel territory insult. Compared to the study of 2021, the findings are slightly progressive. No mass lesion, hemorrhage, hydrocephalus or extra-axial collection. Vascular: Major vessels at the base of the brain show flow. Skull and upper cervical spine: Negative Sinuses/Orbits: Minimal mucosal thickening. No significant sinus finding. Orbits negative. Other:  None MRA HEAD FINDINGS Anterior circulation: Both internal carotid arteries are patent through the skull base and siphon regions. The anterior and middle cerebral vessels are patent. There is mild distal vessel atherosclerotic irregularity. Posterior circulation: Both vertebral arteries are patent to the basilar. No basilar stenosis. There is mild basilar atherosclerotic irregularity. Atherosclerotic irregularity of the PCA branches, with a mild focal stenosis of the right P1 P2 junction. Anatomic variants: None significant. IMPRESSION: No acute finding. Mild chronic small-vessel ischemic change of  the pons and cerebral hemispheric white matter, slightly progressive since 2021. MR angiography negative for acute large vessel occlusion or flow limiting proximal stenosis. There is mild diffuse intracranial atherosclerotic change. Mild focal stenosis at the right P1 P2 junction of the PCA. Electronically Signed   By: Nelson Chimes M.D.   On: 02/28/2022 06:55   MR ANGIO HEAD WO CONTRAST  Result Date: 02/28/2022 CLINICAL DATA:  Transient ischemic attack.  Confusion yesterday. EXAM: MRI HEAD WITHOUT CONTRAST MRA HEAD WITHOUT CONTRAST TECHNIQUE: Multiplanar, multi-echo pulse sequences of the brain and surrounding structures were acquired without intravenous contrast. Angiographic images of the Circle of Willis were acquired using MRA technique without intravenous contrast. COMPARISON:  Head CT same day.  MRI 06/10/2020 FINDINGS: MRI HEAD FINDINGS Brain: Diffusion imaging does not show any acute or subacute infarction. Chronic small-vessel ischemic changes affect the pons. No focal cerebellar insult. Cerebral hemispheres show mild chronic small-vessel ischemic change of the white matter but no cortical or large vessel territory insult. Compared to the study of 2021, the findings are slightly progressive. No mass lesion, hemorrhage, hydrocephalus or extra-axial collection. Vascular: Major vessels at the base of the brain  show flow. Skull and upper cervical spine: Negative Sinuses/Orbits: Minimal mucosal thickening. No significant sinus finding. Orbits negative. Other: None MRA HEAD FINDINGS Anterior circulation: Both internal carotid arteries are patent through the skull base and siphon regions. The anterior and middle cerebral vessels are patent. There is mild distal vessel atherosclerotic irregularity. Posterior circulation: Both vertebral arteries are patent to the basilar. No basilar stenosis. There is mild basilar atherosclerotic irregularity. Atherosclerotic irregularity of the PCA branches, with a mild focal stenosis of the right P1 P2 junction. Anatomic variants: None significant. IMPRESSION: No acute finding. Mild chronic small-vessel ischemic change of the pons and cerebral hemispheric white matter, slightly progressive since 2021. MR angiography negative for acute large vessel occlusion or flow limiting proximal stenosis. There is mild diffuse intracranial atherosclerotic change. Mild focal stenosis at the right P1 P2 junction of the PCA. Electronically Signed   By: Nelson Chimes M.D.   On: 02/28/2022 06:55   CT HEAD WO CONTRAST  Result Date: 02/28/2022 CLINICAL DATA:  Increasing confusion EXAM: CT HEAD WITHOUT CONTRAST TECHNIQUE: Contiguous axial images were obtained from the base of the skull through the vertex without intravenous contrast. RADIATION DOSE REDUCTION: This exam was performed according to the departmental dose-optimization program which includes automated exposure control, adjustment of the mA and/or kV according to patient size and/or use of iterative reconstruction technique. COMPARISON:  03/01/2021 FINDINGS: Brain: No evidence of acute infarction, hemorrhage, hydrocephalus, extra-axial collection or mass lesion/mass effect. Mild atrophic changes are noted. Vascular: No hyperdense vessel or unexpected calcification. Skull: Normal. Negative for fracture or focal lesion. Sinuses/Orbits: No acute  finding. Other: None. IMPRESSION: Mild atrophic changes without acute abnormality. Electronically Signed   By: Inez Catalina M.D.   On: 02/28/2022 02:49   US RENAL  Result Date: 02/03/2022 CLINICAL DATA:  Chronic renal disease EXAM: RENAL / URINARY TRACT ULTRASOUND COMPLETE COMPARISON:  None Available. FINDINGS: Right Kidney: Renal measurements: 9.0 x 3.1 x 4.5 cm = volume: 66 mL. Echogenicity within normal limits. No mass or hydronephrosis visualized. Left Kidney: Renal measurements: 8.3 x 4.4 x 5.0 cm = volume: 94 mL. Echogenicity within normal limits. No mass or hydronephrosis visualized. Bladder: Appears normal for degree of bladder distention. Other: None. IMPRESSION: No hydronephrosis. Electronically Signed   By: Lovey Newcomer M.D.   On: 02/03/2022 19:11  Microbiology: Results for orders placed or performed during the hospital encounter of 02/28/22  Resp Panel by RT-PCR (Flu A&B, Covid) Anterior Nasal Swab     Status: None   Collection Time: 02/28/22  3:10 AM   Specimen: Anterior Nasal Swab  Result Value Ref Range Status   SARS Coronavirus 2 by RT PCR NEGATIVE NEGATIVE Final    Comment: (NOTE) SARS-CoV-2 target nucleic acids are NOT DETECTED.  The SARS-CoV-2 RNA is generally detectable in upper respiratory specimens during the acute phase of infection. The lowest concentration of SARS-CoV-2 viral copies this assay can detect is 138 copies/mL. A negative result does not preclude SARS-Cov-2 infection and should not be used as the sole basis for treatment or other patient management decisions. A negative result may occur with  improper specimen collection/handling, submission of specimen other than nasopharyngeal swab, presence of viral mutation(s) within the areas targeted by this assay, and inadequate number of viral copies(<138 copies/mL). A negative result must be combined with clinical observations, patient history, and epidemiological information. The expected result is  Negative.  Fact Sheet for Patients:  EntrepreneurPulse.com.au  Fact Sheet for Healthcare Providers:  IncredibleEmployment.be  This test is no t yet approved or cleared by the [place] FDA and  has been authorized for detection and/or diagnosis of SARS-CoV-2 by FDA under an Emergency Use Authorization (EUA). This EUA will remain  in effect (meaning this test can be used) for the duration of the COVID-19 declaration under Section 564(b)(1) of the Act, 21 U.S.C.section 360bbb-3(b)(1), unless the authorization is terminated  or revoked sooner.       Influenza A by PCR NEGATIVE NEGATIVE Final   Influenza B by PCR NEGATIVE NEGATIVE Final    Comment: (NOTE) The Xpert Xpress SARS-CoV-2/FLU/RSV plus assay is intended as an aid in the diagnosis of influenza from Nasopharyngeal swab specimens and should not be used as a sole basis for treatment. Nasal washings and aspirates are unacceptable for Xpert Xpress SARS-CoV-2/FLU/RSV testing.  Fact Sheet for Patients: EntrepreneurPulse.com.au  Fact Sheet for Healthcare Providers: [employer]  This test is not yet approved or cleared by the [place] FDA and has been authorized for detection and/or diagnosis of SARS-CoV-2 by FDA under an Emergency Use Authorization (EUA). This EUA will remain in effect (meaning this test can be used) for the duration of the COVID-19 declaration under Section 564(b)(1) of the Act, 21 U.S.C. section 360bbb-3(b)(1), unless the authorization is terminated or revoked.  Performed at [facility], [place] [address] [place], [place] [address]     Labs: CBC: Recent Labs  Lab 02/28/22 0129 02/28/22 0317  WBC 8.4 11.1*  NEUTROABS  --  8.6*  HGB 13.3 13.1  HCT 41.3 41.0  MCV 83.8 84.5  PLT 187 591   Basic Metabolic Panel: Recent Labs  Lab 02/28/22 0129  NA 139  K 4.3  CL 105  CO2 24  GLUCOSE 151*  BUN  37*  CREATININE 2.68*  CALCIUM 9.4   Liver Function Tests: Recent Labs  Lab 02/28/22 0129  AST 14*  ALT 19  ALKPHOS 111  BILITOT 0.7  PROT 7.2  ALBUMIN 4.0   CBG: Recent Labs  Lab 02/28/22 0127 02/28/22 0913 02/28/22 1144  GLUCAP 138* 108* 148*    Discharge time spent: greater than 30 minutes.  Signed: Mercy Riding, MD [facility] 02/28/2022

## 2022-02-28 NOTE — H&P (Signed)
History and Physical    Gabriel Fowler YCX:[id number] DOB: Feb 07, 1956 DOA: 02/28/2022  PCP: Elwyn Reach, MD  Patient coming from: Home  Chief Complaint: Speech difficulty  HPI: Gabriel Fowler is a 66 y.o. male with medical history significant of type 2 diabetes, hypertension, history of CVA on Plavix, chronic diastolic heart failure, hyperlipidemia, CKD stage IIIb brought into the ED by family because of speech difficulty.  Family reported that patient was mumbling and they could not understand what he was saying and he could not understand what they were saying to him.  He was also having right arm heaviness at that time.  Upon arrival to the ED, patient was back to his baseline and had no neurologic deficits.  Noted to be hypertensive with blood pressure 233/99 on arrival.  Labs showing WBC 11.1, glucose 151, creatinine 2.6 (was 3.2-3.6 a year ago), blood ethanol level undetectable, COVID and influenza PCR negative, UDS negative, UA not suggestive of infection.  CT head negative for acute finding. He was given IV labetalol 20 mg x 2.  His presentation was concerning for TIA and neurology consulted.  [facility] called to admit.  Patient states he is having headaches since yesterday and his blood pressure has been as high as 231.  States he had difficulty speaking at home tonight and his right arm felt heavy.  He is taking amlodipine regularly but does not take any other medications for hypertension.  States normally his blood pressure runs in the 140s to 150s but when he visited a dentist a month ago he was told that his blood pressure was in the 170s.  He last saw his PCP 6 months ago.  He denies history of stroke.  Denies tobacco use.  Denies chest pain, shortness of breath, or abdominal pain.  Review of Systems:  Review of Systems  All other systems reviewed and are negative.   Past Medical History:  Diagnosis Date   Diabetes mellitus without complication ([place])    Hypercholesteremia     Hypertension     Past Surgical History:  Procedure Laterality Date   EXTRA SKIN REMOVED FROM BACK OF NECK       reports that he has never smoked. He has never used smokeless tobacco. He reports that he does not drink alcohol and does not use drugs.  No Known Allergies  Family History  Problem Relation Age of Onset   Stroke Neg Hx    Heart disease Neg Hx    Cancer Neg Hx     Prior to Admission medications   Medication Sig Start Date End Date Taking? Authorizing Provider  amLODipine (NORVASC) 10 MG tablet Take 10 mg by mouth daily.    [provider]  Cholecalciferol (VITAMIN D3) 50 MCG (2000 UT) TABS Take 2,000 Units by mouth daily.    [provider]  clopidogrel (PLAVIX) 75 MG tablet Take 1 tablet (75 mg total) by mouth daily. Patient not taking: Reported on 10/28/2021 09/04/15   Barton Dubois, MD  gabapentin (NEURONTIN) 100 MG capsule Take 100 mg by mouth 3 (three) times daily. 02/15/21   [provider]  glipiZIDE (GLUCOTROL) 5 MG tablet Take 5 mg by mouth 2 (two) times daily. 02/23/21   [provider]  isosorbide mononitrate (IMDUR) 30 MG 24 hr tablet Take 2 tablets (60 mg total) by mouth daily. Patient not taking: Reported on 10/28/2021 03/05/21   Charlynne Cousins, MD  pravastatin (PRAVACHOL) 40 MG tablet Take 1 tablet (40 mg total) by  mouth daily. Patient not taking: Reported on 03/02/2021 09/04/15   Barton Dubois, MD  zinc gluconate 50 MG tablet Take 50 mg by mouth daily.    [provider]    Physical Exam: Vitals:   02/28/22 0430 02/28/22 0445 02/28/22 0500 02/28/22 0515  BP: (!) 198/92 (!) 188/94 (!) 191/92 (!) 181/90  Pulse: 78 78 78 77  Resp: (!) 22 (!) 24 20 18   Temp:    98 F (36.7 C)  TempSrc:    Oral  SpO2: 98% 98% 98% 98%  Weight:      Height:        Physical Exam Vitals reviewed.  Constitutional:      General: He is not in acute distress. HENT:     Head: Normocephalic and atraumatic.  Eyes:      Extraocular Movements: Extraocular movements intact.  Cardiovascular:     Rate and Rhythm: Normal rate and regular rhythm.     Pulses: Normal pulses.  Pulmonary:     Effort: Pulmonary effort is normal. No respiratory distress.     Breath sounds: Normal breath sounds. No wheezing or rales.  Abdominal:     General: Bowel sounds are normal. There is no distension.     Palpations: Abdomen is soft.     Tenderness: There is no abdominal tenderness.  Musculoskeletal:        General: No swelling or tenderness.     Cervical back: Normal range of motion.  Skin:    General: Skin is warm and dry.  Neurological:     General: No focal deficit present.     Mental Status: He is alert and oriented to person, place, and time.     Cranial Nerves: No cranial nerve deficit.     Sensory: No sensory deficit.     Motor: No weakness.      Labs on Admission: I have personally reviewed following labs and imaging studies  CBC: Recent Labs  Lab 02/28/22 0129 02/28/22 0317  WBC 8.4 11.1*  NEUTROABS  --  8.6*  HGB 13.3 13.1  HCT 41.3 41.0  MCV 83.8 84.5  PLT 187 629   Basic Metabolic Panel: Recent Labs  Lab 02/28/22 0129  NA 139  K 4.3  CL 105  CO2 24  GLUCOSE 151*  BUN 37*  CREATININE 2.68*  CALCIUM 9.4   GFR: Estimated Creatinine Clearance: 31.5 mL/min (A) (by C-G formula based on SCr of 2.68 mg/dL (H)). Liver Function Tests: Recent Labs  Lab 02/28/22 0129  AST 14*  ALT 19  ALKPHOS 111  BILITOT 0.7  PROT 7.2  ALBUMIN 4.0   No results for input(s): "LIPASE", "AMYLASE" in the last 168 hours. No results for input(s): "AMMONIA" in the last 168 hours. Coagulation Profile: No results for input(s): "INR", "PROTIME" in the last 168 hours. Cardiac Enzymes: No results for input(s): "CKTOTAL", "CKMB", "CKMBINDEX", "TROPONINI" in the last 168 hours. BNP (last 3 results) No results for input(s): "PROBNP" in the last 8760 hours. HbA1C: No results for input(s): "HGBA1C" in the last 72  hours. CBG: Recent Labs  Lab 02/28/22 0127  GLUCAP 138*   Lipid Profile: No results for input(s): "CHOL", "HDL", "LDLCALC", "TRIG", "CHOLHDL", "LDLDIRECT" in the last 72 hours. Thyroid Function Tests: No results for input(s): "TSH", "T4TOTAL", "FREET4", "T3FREE", "THYROIDAB" in the last 72 hours. Anemia Panel: No results for input(s): "VITAMINB12", "FOLATE", "FERRITIN", "TIBC", "IRON", "RETICCTPCT" in the last 72 hours. Urine analysis:    Component Value Date/Time   COLORURINE  YELLOW 02/28/2022 0310   APPEARANCEUR CLEAR 02/28/2022 0310   LABSPEC 1.011 02/28/2022 0310   PHURINE 6.0 02/28/2022 0310   GLUCOSEU NEGATIVE 02/28/2022 0310   HGBUR SMALL (A) 02/28/2022 0310   BILIRUBINUR NEGATIVE 02/28/2022 0310   KETONESUR NEGATIVE 02/28/2022 0310   PROTEINUR >=300 (A) 02/28/2022 0310   NITRITE NEGATIVE 02/28/2022 0310   LEUKOCYTESUR NEGATIVE 02/28/2022 0310    Radiological Exams on Admission: CT HEAD WO CONTRAST  Result Date: 02/28/2022 CLINICAL DATA:  Increasing confusion EXAM: CT HEAD WITHOUT CONTRAST TECHNIQUE: Contiguous axial images were obtained from the base of the skull through the vertex without intravenous contrast. RADIATION DOSE REDUCTION: This exam was performed according to the departmental dose-optimization program which includes automated exposure control, adjustment of the mA and/or kV according to patient size and/or use of iterative reconstruction technique. COMPARISON:  03/01/2021 FINDINGS: Brain: No evidence of acute infarction, hemorrhage, hydrocephalus, extra-axial collection or mass lesion/mass effect. Mild atrophic changes are noted. Vascular: No hyperdense vessel or unexpected calcification. Skull: Normal. Negative for fracture or focal lesion. Sinuses/Orbits: No acute finding. Other: None. IMPRESSION: Mild atrophic changes without acute abnormality. Electronically Signed   By: Inez Catalina M.D.   On: 02/28/2022 02:49    EKG: Independently reviewed.  Sinus rhythm,  no significant change since prior tracing.  Assessment and Plan  Transient neurologic symptoms CT head negative for acute finding and no focal neurodeficit at this time.  Discussed with neurology, his presentation is concerning for TIA. -Neurology consulted -Telemetry monitoring -MRI of the brain without contrast -Echocardiogram -Hemoglobin A1c, fasting lipid panel -Antiplatelet therapy per neurology recommendations -Frequent neurochecks -PT, OT, speech therapy. -Passed bedside swallow eval, diet ordered  Hypertensive urgency Systolic initially in the 230s and received IV labetalol 20 mg x 2 in the ED.  Most recent blood pressure 185/87. -Given concern for stroke/TIA, neurology recommending permissive hypertension at this time. -IV labetalol prn SBP >220 or DBP >110  Borderline leukocytosis Likely reactive.  No infectious signs or symptoms. -Continue to monitor  Type 2 diabetes A1c 6.3 a year ago. -Repeat A1c -Sensitive sliding scale insulin ACHS  Chronic diastolic heart failure Appears euvolemic. -Check BNP  Hyperlipidemia -Lipid panel -Pharmacy med rec pending.  CKD stage IIIb Stable.  Creatinine 2.6 (was 3.2-3.6 a year ago). -Continue to monitor  DVT prophylaxis: Lovenox Code Status: Full Code (discussed with the patient) Family Communication: No family available at this time. Consults called: Neurology Level of care: Progressive Care Unit Admission status: It is my clinical opinion that referral for OBSERVATION is reasonable and necessary in this patient based on the above information provided. The aforementioned taken together are felt to place the patient at high risk for further clinical deterioration. However, it is anticipated that the patient may be medically stable for discharge from the hospital within 24 to 48 hours.   Shela Leff MD [facility]  If 7PM-7AM, please contact night-coverage [email or web address]  02/28/2022, 5:42 AM

## 2022-02-28 NOTE — Progress Notes (Addendum)
STROKE TEAM PROGRESS NOTE   INTERVAL HISTORY No family at the bedside, iPad translator used for exam and education. Patient tells me that he did have a stroke 1 year ago and he was seen at [facility]. He states that he did not follow up with neurology outpatient. Today he is feeling well. He denies nausea, vomiting, headache or dizziness today. Neurologically and hemodynamically stable.   Vitals:   02/28/22 0545 02/28/22 0600 02/28/22 0918 02/28/22 0936  BP: (!) 181/88 (!) 185/87  (!) 154/99  Pulse: 79 78    Resp: (!) 22 (!) 22    Temp:   98 F (36.7 C)   TempSrc:   Oral   SpO2: 98% 98%    Weight:      Height:       CBC:  Recent Labs  Lab 02/28/22 0129 02/28/22 0317  WBC 8.4 11.1*  NEUTROABS  --  8.6*  HGB 13.3 13.1  HCT 41.3 41.0  MCV 83.8 84.5  PLT 187 440   Basic Metabolic Panel:  Recent Labs  Lab 02/28/22 0129  NA 139  K 4.3  CL 105  CO2 24  GLUCOSE 151*  BUN 37*  CREATININE 2.68*  CALCIUM 9.4   Lipid Panel: No results for input(s): "CHOL", "TRIG", "HDL", "CHOLHDL", "VLDL", "LDLCALC" in the last 168 hours. HgbA1c: No results for input(s): "HGBA1C" in the last 168 hours. Urine Drug Screen:  Recent Labs  Lab 02/28/22 0310  LABOPIA NONE DETECTED  COCAINSCRNUR NONE DETECTED  LABBENZ NONE DETECTED  AMPHETMU NONE DETECTED  THCU NONE DETECTED  LABBARB NONE DETECTED    Alcohol Level  Recent Labs  Lab 02/28/22 0204  ETH <10    IMAGING past 24 hours VAS US CAROTID  Result Date: 02/28/2022 Carotid Arterial Duplex Study Patient Name:  JOHNDAVID GERALDS  Date of Exam:   02/28/2022 Medical Rec #: [id number]        Accession #:    [id number] Date of Birth: [birth year]         Patient Gender: M Patient Age:   66 years Exam Location:  [facility] Procedure:      VAS US CAROTID Referring Phys: Bretta Bang GONFA --------------------------------------------------------------------------------  Indications:       TIA. Risk Factors:      Hypertension, hyperlipidemia,  Diabetes. Comparison Study:  09-04-2015 Prior carotid duplex for TIA. Performing Technologist: Darlin Coco RDMS, RVT  Examination Guidelines: A complete evaluation includes B-mode imaging, spectral Doppler, color Doppler, and power Doppler as needed of all accessible portions of each vessel. Bilateral testing is considered an integral part of a complete examination. Limited examinations for reoccurring indications may be performed as noted.  Right Carotid Findings: +----------+--------+--------+--------+------------------------------+--------+           PSV cm/sEDV cm/sStenosisPlaque Description            Comments +----------+--------+--------+--------+------------------------------+--------+ CCA Prox  96      11                                                     +----------+--------+--------+--------+------------------------------+--------+ CCA Distal65      15              focal, smooth and heterogenous         +----------+--------+--------+--------+------------------------------+--------+ ICA Prox  74      21                                                     +----------+--------+--------+--------+------------------------------+--------+  ICA Mid   107     29                                                     +----------+--------+--------+--------+------------------------------+--------+ ICA Distal116     31                                                     +----------+--------+--------+--------+------------------------------+--------+ ECA       98      20                                                     +----------+--------+--------+--------+------------------------------+--------+ +----------+--------+-------+----------------+-------------------+           PSV cm/sEDV cmsDescribe        Arm Pressure (mmHG) +----------+--------+-------+----------------+-------------------+ [id number]            Multiphasic, WNL                     +----------+--------+-------+----------------+-------------------+ +---------+--------+--+--------+--+---------+ VertebralPSV cm/s58EDV cm/s17Antegrade +---------+--------+--+--------+--+---------+  Left Carotid Findings: +----------+--------+--------+--------+------------------+------------------+           PSV cm/sEDV cm/sStenosisPlaque DescriptionComments           +----------+--------+--------+--------+------------------+------------------+ CCA Prox  134     21                                                   +----------+--------+--------+--------+------------------+------------------+ CCA Distal78      19                                intimal thickening +----------+--------+--------+--------+------------------+------------------+ ICA Prox  61      19                                                   +----------+--------+--------+--------+------------------+------------------+ ICA Mid   96      30                                                   +----------+--------+--------+--------+------------------+------------------+ ICA Distal105     36                                                   +----------+--------+--------+--------+------------------+------------------+ ECA       109     14                                                   +----------+--------+--------+--------+------------------+------------------+ +----------+--------+--------+----------------+-------------------+  PSV cm/sEDV cm/sDescribe        Arm Pressure (mmHG) +----------+--------+--------+----------------+-------------------+ Subclavian116             Multiphasic, WNL                    +----------+--------+--------+----------------+-------------------+ +---------+--------+--+--------+--+---------+ VertebralPSV cm/s60EDV cm/s15Antegrade +---------+--------+--+--------+--+---------+   Summary: Right Carotid: The extracranial vessels were near-normal with  only minimal wall                thickening or plaque. Left Carotid: The extracranial vessels were near-normal with only minimal wall               thickening or plaque. Vertebrals:  Bilateral vertebral arteries demonstrate antegrade flow. Subclavians: Normal flow hemodynamics were seen in bilateral subclavian              arteries. *See table(s) above for measurements and observations.     Preliminary    MR BRAIN WO CONTRAST  Result Date: 02/28/2022 CLINICAL DATA:  Transient ischemic attack.  Confusion yesterday. EXAM: MRI HEAD WITHOUT CONTRAST MRA HEAD WITHOUT CONTRAST TECHNIQUE: Multiplanar, multi-echo pulse sequences of the brain and surrounding structures were acquired without intravenous contrast. Angiographic images of the Circle of Willis were acquired using MRA technique without intravenous contrast. COMPARISON:  Head CT same day.  MRI 06/10/2020 FINDINGS: MRI HEAD FINDINGS Brain: Diffusion imaging does not show any acute or subacute infarction. Chronic small-vessel ischemic changes affect the pons. No focal cerebellar insult. Cerebral hemispheres show mild chronic small-vessel ischemic change of the white matter but no cortical or large vessel territory insult. Compared to the study of 2021, the findings are slightly progressive. No mass lesion, hemorrhage, hydrocephalus or extra-axial collection. Vascular: Major vessels at the base of the brain show flow. Skull and upper cervical spine: Negative Sinuses/Orbits: Minimal mucosal thickening. No significant sinus finding. Orbits negative. Other: None MRA HEAD FINDINGS Anterior circulation: Both internal carotid arteries are patent through the skull base and siphon regions. The anterior and middle cerebral vessels are patent. There is mild distal vessel atherosclerotic irregularity. Posterior circulation: Both vertebral arteries are patent to the basilar. No basilar stenosis. There is mild basilar atherosclerotic irregularity. Atherosclerotic irregularity  of the PCA branches, with a mild focal stenosis of the right P1 P2 junction. Anatomic variants: None significant. IMPRESSION: No acute finding. Mild chronic small-vessel ischemic change of the pons and cerebral hemispheric white matter, slightly progressive since 2021. MR angiography negative for acute large vessel occlusion or flow limiting proximal stenosis. There is mild diffuse intracranial atherosclerotic change. Mild focal stenosis at the right P1 P2 junction of the PCA. Electronically Signed   By: Nelson Chimes M.D.   On: 02/28/2022 06:55   MR ANGIO HEAD WO CONTRAST  Result Date: 02/28/2022 CLINICAL DATA:  Transient ischemic attack.  Confusion yesterday. EXAM: MRI HEAD WITHOUT CONTRAST MRA HEAD WITHOUT CONTRAST TECHNIQUE: Multiplanar, multi-echo pulse sequences of the brain and surrounding structures were acquired without intravenous contrast. Angiographic images of the Circle of Willis were acquired using MRA technique without intravenous contrast. COMPARISON:  Head CT same day.  MRI 06/10/2020 FINDINGS: MRI HEAD FINDINGS Brain: Diffusion imaging does not show any acute or subacute infarction. Chronic small-vessel ischemic changes affect the pons. No focal cerebellar insult. Cerebral hemispheres show mild chronic small-vessel ischemic change of the white matter but no cortical or large vessel territory insult. Compared to the study of 2021, the findings are slightly progressive. No mass lesion, hemorrhage, hydrocephalus or extra-axial collection. Vascular: Major vessels at  the base of the brain show flow. Skull and upper cervical spine: Negative Sinuses/Orbits: Minimal mucosal thickening. No significant sinus finding. Orbits negative. Other: None MRA HEAD FINDINGS Anterior circulation: Both internal carotid arteries are patent through the skull base and siphon regions. The anterior and middle cerebral vessels are patent. There is mild distal vessel atherosclerotic irregularity. Posterior circulation: Both  vertebral arteries are patent to the basilar. No basilar stenosis. There is mild basilar atherosclerotic irregularity. Atherosclerotic irregularity of the PCA branches, with a mild focal stenosis of the right P1 P2 junction. Anatomic variants: None significant. IMPRESSION: No acute finding. Mild chronic small-vessel ischemic change of the pons and cerebral hemispheric white matter, slightly progressive since 2021. MR angiography negative for acute large vessel occlusion or flow limiting proximal stenosis. There is mild diffuse intracranial atherosclerotic change. Mild focal stenosis at the right P1 P2 junction of the PCA. Electronically Signed   By: Nelson Chimes M.D.   On: 02/28/2022 06:55   CT HEAD WO CONTRAST  Result Date: 02/28/2022 CLINICAL DATA:  Increasing confusion EXAM: CT HEAD WITHOUT CONTRAST TECHNIQUE: Contiguous axial images were obtained from the base of the skull through the vertex without intravenous contrast. RADIATION DOSE REDUCTION: This exam was performed according to the departmental dose-optimization program which includes automated exposure control, adjustment of the mA and/or kV according to patient size and/or use of iterative reconstruction technique. COMPARISON:  03/01/2021 FINDINGS: Brain: No evidence of acute infarction, hemorrhage, hydrocephalus, extra-axial collection or mass lesion/mass effect. Mild atrophic changes are noted. Vascular: No hyperdense vessel or unexpected calcification. Skull: Normal. Negative for fracture or focal lesion. Sinuses/Orbits: No acute finding. Other: None. IMPRESSION: Mild atrophic changes without acute abnormality. Electronically Signed   By: Inez Catalina M.D.   On: 02/28/2022 02:49    PHYSICAL EXAM  Physical Exam  Constitutional: Appears well-developed and well-nourished.   Cardiovascular: Normal rate and regular rhythm.  Respiratory: Effort normal, non-labored breathing  Neuro: Mental Status: Patient is awake, alert, oriented to person,  place, month, year, and situation. Patient is able to give a clear and coherent history. No signs of aphasia or neglect Cranial Nerves: II: Visual Fields are full. Pupils are equal, round, and reactive to light.   III,IV, VI: EOMI without ptosis or diploplia.  V: Facial sensation is symmetric to temperature VII: Facial movement is symmetric resting and smiling VIII: Hearing is intact to voice X: Palate elevates symmetrically XI: Shoulder shrug is symmetric. XII: Tongue protrudes midline without atrophy or fasciculations.  Motor: Tone is normal. Bulk is normal. 5/5 strength was present in all four extremities.  Sensory: Sensation is symmetric to light touch and temperature in the arms and legs. No extinction to DSS present.  Cerebellar: FNF and HKS are intact bilaterally    ASSESSMENT/PLAN Mr. London Tarnowski is a 66 y.o. male with history of DM2, HTN, HLD, prior TIAs but no definite prior stroke, who went to bed at 2100 on 02/27/22 and woke up at 2300 with headache, nausea and then developed R hand heaviness and family unable to understand him and he was not able to comprehend them. Lasted 2 hours and resolved. Missed his bp medications due to a medication change. Developed a cough with lisinopril and was changed to losartan. States he is typically compliant with his medications.    No family hx of strokes, does not smoke or use any tobacco products, no EtOh use. No hx of afibb.   Was hypertensive to 230s on arrival to the ED which improved  to 180s. CTH is negative. No focal deficit. No headache. Reports confusion in the past with headache but no arm heaviness or focal deficit.  TIA vs. Hypertensive encephalopathy Code Stroke CT head No acute abnormality.  MRI  No acute finding MRA  Mild focal stenosis at the right P1 P2 junction of the PCA. Carotid Doppler  near normal with minimal thickening  2D Echo EF 60-65%, normal LV function LDL 106 HgbA1c 6.3 VTE prophylaxis - SCDs aspirin  81 mg daily prior to admission, now on aspirin 81 mg daily and clopidogrel 75 mg daily for 3 weeks and then plavix alone.  Therapy recommendations:  None Disposition:  Home  Hypertension Home meds:  Cozaar, amlodipine States he was off his BP medications for a week, BP high on presentation Stable now Long-term BP goal normotensive  Hyperlipidemia Home meds:  pravastatin 40 LDL 106, goal < 70 Now on crestor this patient should put on Crestor 20 mg nightly daily started on Crestor  Continue statin at discharge  Diabetes type II Controlled Home meds:  glipizide HgbA1c 6.3, goal < 7.0 CBGs SSI Close PCP follow-up  Other Stroke Risk Factors Hx stroke/TIA vs. Hypertensive encephalopathy Previously seen in the ED with TIA like episodes with reported stroke last year. No previous neurology notes seen.   Other Active Problems Mild leukocytosis, WBC 11.1 CKD stage 4 Cr at baseline   Hospital day # 0  Patient seen and examined by NP/APP with MD. MD to update note as needed.   Janine Ores, DNP, FNP-BC [facility] Pager: [phone]  ATTENDING NOTE: I reviewed above note and agree with the assessment and plan. Pt was seen and examined.   66 year old male with history of hypertension, hyperlipidemia, diabetes, TIA admitted for headache and right hand heaviness with speech difficulty for 5 minutes.  Per patient, 1 year ago he had a similar episode with headache and left leg numbness for 5 minutes with elevated BP.  Yesterday he again had elevated BP with headache and then had difficulty talking and right hand heaviness feeling about 4 to 5 minutes and then resolved.  Currently neuro intact ED.  BP much improved.  CT, MRI, MRA, carotid Doppler and 2D echo all negative.  LDL 106, A1c 6.3 and UDS negative.  Creatinine 2.68, WBC 11.1.  On exam, online interpreter assisted for exam, neuro intact.  No focal deficit.  Etiology for patient symptoms concerning for TIA versus  hypertensive encephalopathy.  Given uncontrolled risk factors, will treat as TIA, recommend aspirin and Plavix DAPT for 3 weeks and then Plavix alone.  Change pravastatin to Crestor on discharge.  Aggressive risk modification education provided.  PT/OT no recommendation.  For detailed assessment and plan, please refer to above/below as I have made changes wherever appropriate.   Neurology will sign off. Please call with questions. Pt will follow up with stroke clinic NP at [facility] in about 4 weeks. Thanks for the consult.   Rosalin Hawking, MD PhD Stroke Neurology 02/28/2022 2:34 PM     To contact Stroke Continuity provider, please refer to [email or web address] After hours, contact General Neurology

## 2022-02-28 NOTE — Consult Note (Signed)
NEUROLOGY CONSULTATION NOTE   Date of service: February 28, 2022 Patient Name: Gabriel Fowler MRN:  [id number] DOB:  [birth year] Reason for consult: "Episode of R hand heaviness and garbled speech and unable to understand language" Requesting Provider: Delora Fuel, MD _ _ _   _ __   _ __ _ _  __ __   _ __   __ _  History of Present Illness  Gabriel Fowler is a 66 y.o. male with PMH significant for DM2, HTN, HLD, prior stroke with no residual deficit who went to bed at 2100 on 02/27/22 and woke up at 2300 with headache and then developed R hand heaviness and family unable to understand him and he was not able to comprehend them. Lasted 2 hours and resolved.  No family hx of strokes, does not smoke or use any tobacco products, no EtOh use. No hx of afibb.  Was hypertensive to 230s on arrival to the ED which improved to 180s. CTH is negative. No focal deficit. No headache. Reports confusion in the past with headache but no arm heaviness or focal deficit.  LKW: 2100 on 02/27/22 mRS: 0 tNKASE: not offered, symptoms resolved. Thrombectomy: not offered, symptoms resolved. NIHSS components Score: Comment  1a Level of Conscious 0[x]  1[]  2[]  3[]      1b LOC Questions 0[x]  1[]  2[]       1c LOC Commands 0[x]  1[]  2[]       2 Best Gaze 0[x]  1[]  2[]       3 Visual 0[x]  1[]  2[]  3[]      4 Facial Palsy 0[x]  1[]  2[]  3[]      5a Motor Arm - left 0[x]  1[]  2[]  3[]  4[]  UN[]    5b Motor Arm - Right 0[x]  1[]  2[]  3[]  4[]  UN[]    6a Motor Leg - Left 0[x]  1[]  2[]  3[]  4[]  UN[]    6b Motor Leg - Right 0[x]  1[]  2[]  3[]  4[]  UN[]    7 Limb Ataxia 0[x]  1[]  2[]  3[]  UN[]     8 Sensory 0[x]  1[]  2[]  UN[]      9 Best Language 0[x]  1[]  2[]  3[]      10 Dysarthria 0[x]  1[]  2[]  UN[]      11 Extinct. and Inattention 0[x]  1[]  2[]       TOTAL: 0      ROS   Constitutional Denies weight loss, fever and chills.   HEENT Denies changes in vision and hearing.   Respiratory Denies SOB and cough.   CV Denies palpitations and CP   GI Denies  abdominal pain, nausea, vomiting and diarrhea.   GU Denies dysuria and urinary frequency.   MSK Denies myalgia and joint pain.   Skin Denies rash and pruritus.   Neurological Denies headache and syncope.   Psychiatric Denies recent changes in mood. Denies anxiety and depression.    Past History   Past Medical History:  Diagnosis Date   Diabetes mellitus without complication ([place])    Hypercholesteremia    Hypertension    Past Surgical History:  Procedure Laterality Date   EXTRA SKIN REMOVED FROM BACK OF NECK     Family History  Problem Relation Age of Onset   Stroke Neg Hx    Heart disease Neg Hx    Cancer Neg Hx    Social History   Socioeconomic History   Marital status: Married    Spouse name: Not on file   Number of children: 3   Years of education: Not on file   Highest education level: Not on file  Occupational History  Occupation: Disabled  Tobacco Use   Smoking status: Never   Smokeless tobacco: Never  Vaping Use   Vaping Use: Never used  Substance and Sexual Activity   Alcohol use: No   Drug use: No   Sexual activity: Yes  Other Topics Concern   Not on file  Social History Narrative   Married.  Lives with wife.   Social Determinants of Health   Financial Resource Strain: Not on file  Food Insecurity: Not on file  Transportation Needs: Not on file  Physical Activity: Not on file  Stress: Not on file  Social Connections: Not on file   No Known Allergies  Medications  (Not in a hospital admission)    Vitals   Vitals:   02/28/22 0515 02/28/22 0530 02/28/22 0545 02/28/22 0600  BP: (!) 181/90 (!) 176/90 (!) 181/88 (!) 185/87  Pulse: 77 77 79 78  Resp: 18 (!) 21 (!) 22 (!) 22  Temp: 98 F (36.7 C)     TempSrc: Oral     SpO2: 98% 97% 98% 98%  Weight:      Height:         Body mass index is 24.46 kg/m.  Physical Exam   General: Laying comfortably in bed; in no acute distress.  HENT: Normal oropharynx and mucosa. Normal external  appearance of ears and nose.  Neck: Supple, no pain or tenderness  CV: No JVD. No peripheral edema.  Pulmonary: Symmetric Chest rise. Normal respiratory effort.  Abdomen: Soft to touch, non-tender.  Ext: No cyanosis, edema, or deformity  Skin: No rash. Normal palpation of skin.   Musculoskeletal: Normal digits and nails by inspection. No clubbing.  Neurologic Examination  Mental status/Cognition: Alert, oriented to self, place, month and year, good attention.  Speech/language: Fluent, comprehension intact, object naming intact, repetition intact.  Cranial nerves:   CN II Pupils equal and reactive to light, no VF deficits    CN III,IV,VI EOM intact, no gaze preference or deviation, no nystagmus    CN V normal sensation in V1, V2, and V3 segments bilaterally    CN VII no asymmetry, no nasolabial fold flattening    CN VIII normal hearing to speech    CN IX & X normal palatal elevation, no uvular deviation    CN XI 5/5 head turn and 5/5 shoulder shrug bilaterally    CN XII midline tongue protrusion    Motor:  Muscle bulk: normal, tone normal, pronator drift none tremor none Mvmt Root Nerve  Muscle Right Left Comments  SA C5/6 Ax Deltoid 5 5   EF C5/6 Mc Biceps 5 5   EE C6/7/8 Rad Triceps 5 5   WF C6/7 Med FCR     WE C7/8 PIN ECU     F Ab C8/T1 U ADM/FDI 5 5   HF L1/2/3 Fem Illopsoas 5 5   KE L2/3/4 Fem Quad 5 5   DF L4/5 D Peron Tib Ant 5 5   PF S1/2 Tibial Grc/Sol 5 5    Reflexes:  Right Left Comments  Pectoralis      Biceps (C5/6) 2 2   Brachioradialis (C5/6) 2 2    Triceps (C6/7) 2 2    Patellar (L3/4) 2 2    Achilles (S1)      Hoffman      Plantar     Jaw jerk    Sensation:  Light touch Intact throughout   Pin prick    Temperature    Vibration  Proprioception    Coordination/Complex Motor:  - Finger to Nose intact BL - Heel to shin intact BL - Rapid alternating movement are normal - Gait: Deferred.  Labs   CBC:  Recent Labs  Lab 02/28/22 0129  02/28/22 0317  WBC 8.4 11.1*  NEUTROABS  --  8.6*  HGB 13.3 13.1  HCT 41.3 41.0  MCV 83.8 84.5  PLT 187 440    Basic Metabolic Panel:  Lab Results  Component Value Date   NA 139 02/28/2022   K 4.3 02/28/2022   CO2 24 02/28/2022   GLUCOSE 151 (H) 02/28/2022   BUN 37 (H) 02/28/2022   CREATININE 2.68 (H) 02/28/2022   CALCIUM 9.4 02/28/2022   GFRNONAA 25 (L) 02/28/2022   GFRAA 50 (L) 09/03/2015   Lipid Panel:  Lab Results  Component Value Date   LDLCALC 169 (H) 09/04/2015   HgbA1c:  Lab Results  Component Value Date   HGBA1C 6.3 (H) 03/02/2021   Urine Drug Screen:     Component Value Date/Time   LABOPIA NONE DETECTED 02/28/2022 0310   COCAINSCRNUR NONE DETECTED 02/28/2022 0310   LABBENZ NONE DETECTED 02/28/2022 0310   AMPHETMU NONE DETECTED 02/28/2022 0310   THCU NONE DETECTED 02/28/2022 0310   LABBARB NONE DETECTED 02/28/2022 0310    Alcohol Level     Component Value Date/Time   ETH <10 02/28/2022 0204    CT Head without contrast(Personally reviewed): CTH was negative for a large hypodensity concerning for a large territory infarct or hyperdensity concerning for an ICH  MR Angio head without contrast and Carotid Duplex BL: pending  MRI Brain: pending  Impression   Gabriel Fowler is a 66 y.o. male with PMH significant for DM2, HTN, HLD, prior stroke with no residual deficit who went to bed at 2100 on 02/27/22 and woke up at 2300 with headache and then developed R hand heaviness and family unable to understand him and he was not able to comprehend them. Lasted 2 hours and resolved.  Episode concerning for a TIA. He is back to his baseline.  Primary Diagnosis:  Cerebral infarction, unspecified.  Secondary Diagnosis: Hypertension Emergency (SBP > 180 or DBP > 120 & end organ damage) and Type 2 diabetes mellitus with hyperglycemia   Recommendations   - Frequent Neuro checks per stroke unit protocol - Recommend brain imaging with MRI Brain without  contrast - Recommend Vascular imaging with MRA Angio Head without contrast and US Carotid doppler - Recommend obtaining TTE - Recommend obtaining Lipid panel with LDL - Please start statin if LDL > 70 - Recommend HbA1c - Antithrombotic - Aspirin 81mg  daily along with plavix 75mg  daily x 21days, followed by Aspirin 81mg  daily alone. - Recommend DVT ppx - SBP goal - permissive hypertension first 24 h < 220/110. Held home meds.  - Recommend Telemetry monitoring for arrythmia - Recommend bedside swallow screen prior to PO intake. - Stroke education booklet - Recommend PT/OT/SLP consult ______________________________________________________________________   Thank you for the opportunity to take part in the care of this patient. If you have any further questions, please contact the neurology consultation attending.  Signed,  [place] Pager Number [phone] _ _ _   _ __   _ __ _ _  __ __   _ __   __ _

## 2022-02-28 NOTE — ED Triage Notes (Signed)
Yesterday afternoon pt began feeling sick and, per family, pt was not understanding what family was saying. He was confused. Family states that he seems to have returned to baseline. Pt is hypertensive and has headache. SBP 230. Pt denies cardiac history. Pt takes bp meds and has been compliant.denies chest pain or SOB

## 2022-03-30 ENCOUNTER — Encounter: Payer: Self-pay | Admitting: Adult Health

## 2022-03-30 ENCOUNTER — Ambulatory Visit (INDEPENDENT_AMBULATORY_CARE_PROVIDER_SITE_OTHER): Payer: Medicare Other | Admitting: Adult Health

## 2022-03-30 VITALS — BP 132/64 | HR 65 | Ht 69.0 in | Wt 175.0 lb

## 2022-03-30 DIAGNOSIS — G459 Transient cerebral ischemic attack, unspecified: Secondary | ICD-10-CM

## 2022-03-30 DIAGNOSIS — E785 Hyperlipidemia, unspecified: Secondary | ICD-10-CM

## 2022-03-30 DIAGNOSIS — I674 Hypertensive encephalopathy: Secondary | ICD-10-CM

## 2022-03-30 NOTE — Progress Notes (Signed)
PATIENT: Gabriel Fowler DOB: 1956-04-24  REASON FOR VISIT: follow up HISTORY FROM: patient PRIMARY NEUROLOGIST: Dr. Leonie Man  Chief Complaint  Patient presents with   Follow-up    Pt in 19 with daughter and interpreter Pt here for f/u of TIA Pt has concerns because he had  a stroke in  2020 and TIA in 2023      HISTORY OF PRESENT ILLNESS: Today 03/30/22  Gabriel Fowler is a 66 y.o. male here today for hospital FU for TIA vs. hypertensive encephalopathy.  He returns today for follow-up.  He has not had additional symptoms since he was seen in the hospital.  He lives at home with his wife and kids.  Currently not employed.  Reports that he tries to take his medication daily.  His daughter reports that his kidney specialist advised that he cannot take aspirin.  Therefore he has remained on Plavix  HISTORY Gabriel Fowler is a 66 y.o. male with history of DM2, HTN, HLD, prior TIAs but no definite prior stroke, who went to bed at 2100 on 02/27/22 and woke up at 2300 with headache, nausea and then developed R hand heaviness and family unable to understand him and he was not able to comprehend them. Lasted 2 hours and resolved. Missed his bp medications due to a medication change. Developed a cough with lisinopril and was changed to losartan. States he is typically compliant with his medications.    No family hx of strokes, does not smoke or use any tobacco products, no EtOh use. No hx of afibb.   Was hypertensive to 230s on arrival to the ED which improved to 180s. CTH is negative. No focal deficit. No headache. Reports confusion in the past with headache but no arm heaviness or focal deficit.  TIA vs. Hypertensive encephalopathy Code Stroke CT head No acute abnormality.  MRI  No acute finding MRA  Mild focal stenosis at the right P1 P2 junction of the PCA. Carotid Doppler  near normal with minimal thickening  2D Echo EF 60-65%, normal LV function LDL 106 HgbA1c 6.3 VTE prophylaxis -  SCDs aspirin 81 mg daily prior to admission, now on aspirin 81 mg daily and clopidogrel 75 mg daily for 3 weeks and then plavix alone.   REVIEW OF SYSTEMS: Out of a complete 14 system review of symptoms, the patient complains only of the following symptoms, and all other reviewed systems are negative.  ALLERGIES: Allergies  Allergen Reactions   Lisinopril Cough    HOME MEDICATIONS: Outpatient Medications Prior to Visit  Medication Sig Dispense Refill   amLODipine (NORVASC) 10 MG tablet Take 10 mg by mouth daily.     Cholecalciferol (VITAMIN D3) 50 MCG (2000 UT) TABS Take 2,000 Units by mouth daily.     clopidogrel (PLAVIX) 75 MG tablet Take 1 tablet (75 mg total) by mouth daily. 90 tablet 3   Ferrous Sulfate (IRON PO) Take 1 capsule by mouth daily.     glipiZIDE (GLUCOTROL) 5 MG tablet Take 5 mg by mouth 2 (two) times daily.     losartan (COZAAR) 50 MG tablet Take 50 mg by mouth daily.     rosuvastatin (CRESTOR) 10 MG tablet Take 1 tablet (10 mg total) by mouth daily. 90 tablet 3   sodium bicarbonate 650 MG tablet Take 650 mg by mouth 2 (two) times daily.     No facility-administered medications prior to visit.    PAST MEDICAL HISTORY: Past Medical History:  Diagnosis Date  Diabetes mellitus without complication ([place])    Hypercholesteremia    Hypertension     PAST SURGICAL HISTORY: Past Surgical History:  Procedure Laterality Date   EXTRA SKIN REMOVED FROM BACK OF NECK      FAMILY HISTORY: Family History  Problem Relation Age of Onset   Stroke Neg Hx    Heart disease Neg Hx    Cancer Neg Hx     SOCIAL HISTORY: Social History   Socioeconomic History   Marital status: Married    Spouse name: Not on file   Number of children: 3   Years of education: Not on file   Highest education level: Not on file  Occupational History   Occupation: Disabled  Tobacco Use   Smoking status: Never   Smokeless tobacco: Never  Vaping Use   Vaping Use: Never used  Substance  and Sexual Activity   Alcohol use: No   Drug use: No   Sexual activity: Yes  Other Topics Concern   Not on file  Social History Narrative   Married.  Lives with wife.   Social Determinants of Health   Financial Resource Strain: Not on file  Food Insecurity: Not on file  Transportation Needs: Not on file  Physical Activity: Not on file  Stress: Not on file  Social Connections: Not on file  Intimate Partner Violence: Not on file      PHYSICAL EXAM  Vitals:   03/30/22 0932  BP: 132/64  Pulse: 65  Weight: 175 lb (79.4 kg)  Height: 5\' 9"  (1.753 m)   Body mass index is 25.84 kg/m.  Generalized: Well developed, in no acute distress   Neurological examination  Mentation: Alert oriented to time, place, history taking. Follows all commands speech and language fluent Cranial nerve II-XII: Pupils were equal round reactive to light. Extraocular movements were full, visual field were full on confrontational test. Facial sensation and strength were normal. Uvula tongue midline. Head turning and shoulder shrug  were normal and symmetric. Motor: The motor testing reveals 5 over 5 strength of all 4 extremities. Good symmetric motor tone is noted throughout.  Sensory: Sensory testing is intact to soft touch on all 4 extremities. No evidence of extinction is noted.  Coordination: Cerebellar testing reveals good finger-nose-finger and heel-to-shin bilaterally.  Gait and station: Gait is normal.    DIAGNOSTIC DATA (LABS, IMAGING, TESTING) - I reviewed patient records, labs, notes, testing and imaging myself where available.  Lab Results  Component Value Date   WBC 11.1 (H) 02/28/2022   HGB 13.1 02/28/2022   HCT 41.0 02/28/2022   MCV 84.5 02/28/2022   PLT 174 02/28/2022      Component Value Date/Time   NA 139 02/28/2022 0129   K 4.3 02/28/2022 0129   CL 105 02/28/2022 0129   CO2 24 02/28/2022 0129   GLUCOSE 151 (H) 02/28/2022 0129   BUN 37 (H) 02/28/2022 0129   CREATININE  2.68 (H) 02/28/2022 0129   CALCIUM 9.4 02/28/2022 0129   PROT 7.2 02/28/2022 0129   ALBUMIN 4.0 02/28/2022 0129   AST 14 (L) 02/28/2022 0129   ALT 19 02/28/2022 0129   ALKPHOS 111 02/28/2022 0129   BILITOT 0.7 02/28/2022 0129   GFRNONAA 25 (L) 02/28/2022 0129   GFRAA 50 (L) 09/03/2015 1334   Lab Results  Component Value Date   CHOL 175 02/28/2022   HDL 57 02/28/2022   LDLCALC 106 (H) 02/28/2022   TRIG 62 02/28/2022   CHOLHDL 3.1 02/28/2022  Lab Results  Component Value Date   HGBA1C 6.7 (H) 02/28/2022      ASSESSMENT AND PLAN 66 y.o. year old male  has a past medical history of Diabetes mellitus without complication ([place]), Hypercholesteremia, and Hypertension. here with:  TIA vs. hypertensive encephalopathy   Continue Plavix 75 mg daily for secondary stroke prevention.  Discussed secondary stroke prevention measures and importance of close PCP follow up for aggressive stroke risk factor management. I have gone over the pathophysiology of stroke, warning signs and symptoms, risk factors and their management in some detail with instructions to go to the closest emergency room for symptoms of concern. HTN: BP goal <130/90.   HLD: LDL goal <70. Recent LDL 106.  DMII: A1c goal<7.0. Recent A1c 6.3.  Encouraged patient to monitor diet and encouraged exercise FU with our office PRN       Ward Givens, MSN, NP-C 03/30/2022, 9:06 AM [facility] [address], [place], [place] [address] [phone]

## 2022-03-30 NOTE — Patient Instructions (Signed)
Continue Plavix 75 mg daily for secondary stroke prevention.   HTN: BP goal <130/90.   HLD: LDL goal <70. Recent LDL 106.  DMII: A1c goal<7.0. Recent A1c 6.3.   monitor diet and encouraged exercise

## 2022-04-07 ENCOUNTER — Inpatient Hospital Stay: Payer: Medicare Other | Admitting: Adult Health

## 2023-03-04 IMAGING — DX DG FINGER LITTLE 2+V*L*
3 series · 3 of 3 positions shown · non-contrast
Comparison: Left hand x-rays dated October 27, 2019.

CLINICAL DATA: Left small finger injury yesterday.

EXAM:
LEFT LITTLE FINGER 2+V

[finger ap]
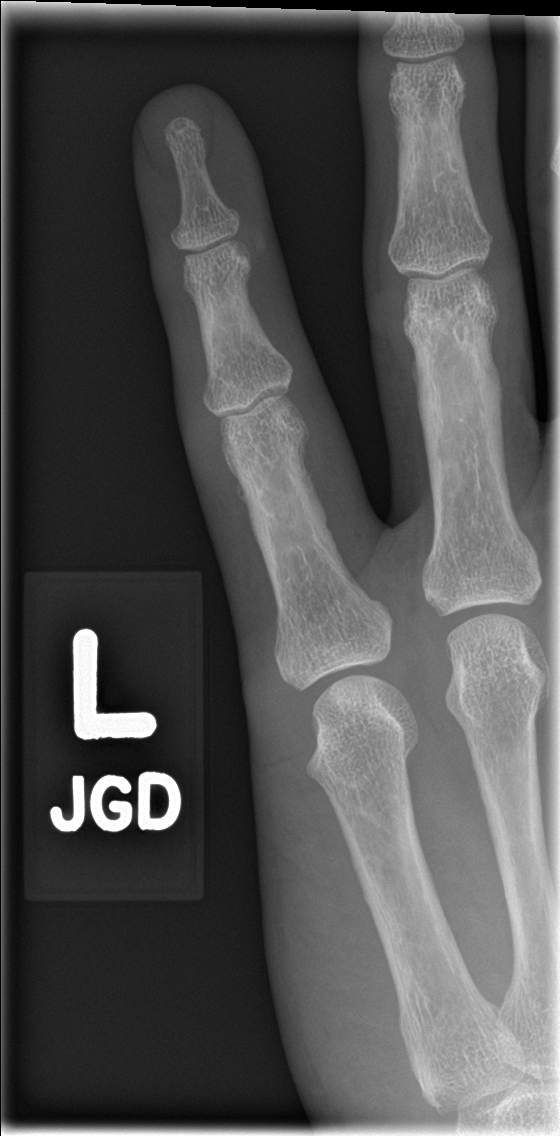

[finger obl]
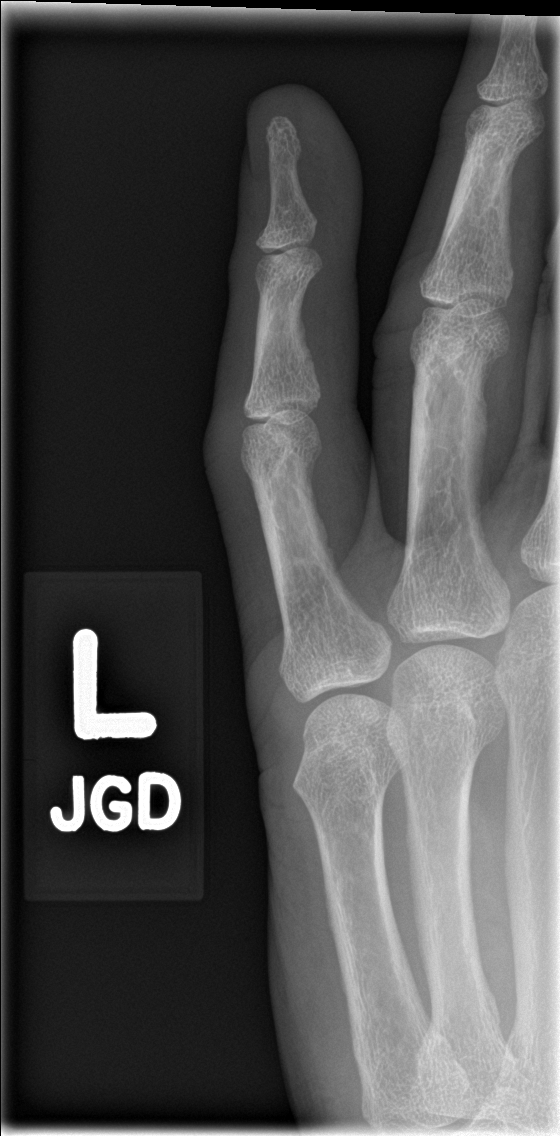

[finger lat]
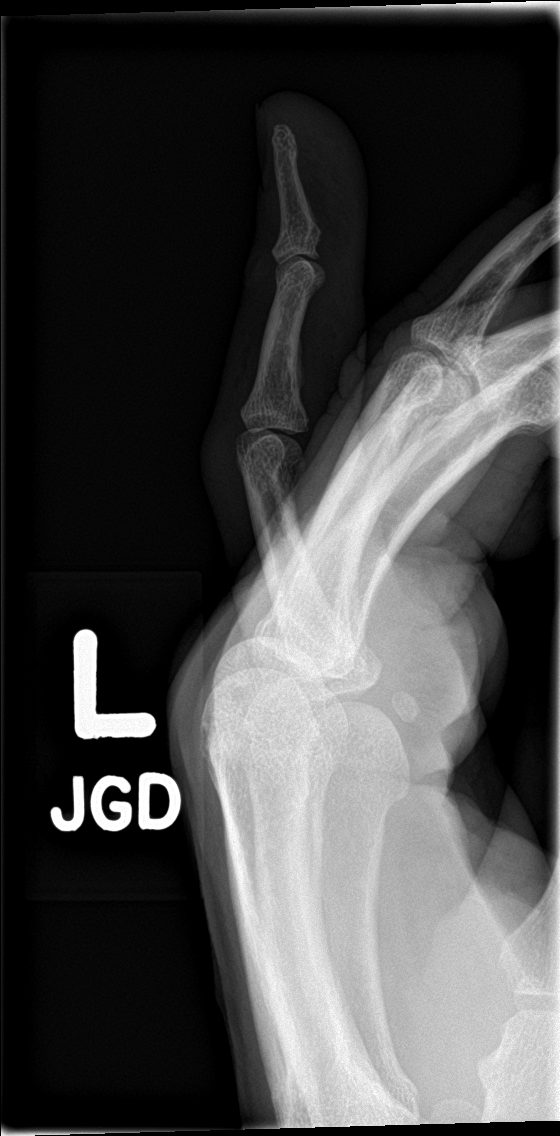

[3 of 3 positions shown; findings below may reference images not displayed]

FINDINGS: There is no evidence of fracture or dislocation. There is no
evidence of arthropathy or other focal bone abnormality. Soft
tissues are unremarkable.
IMPRESSION: Negative.

## 2023-03-15 IMAGING — DX DG CHEST 1V PORT
1 series · 1 of 1 positions shown · non-contrast
Comparison: None.

CLINICAL DATA: Sepsis.

EXAM:
PORTABLE CHEST 1 VIEW

[chest ap]
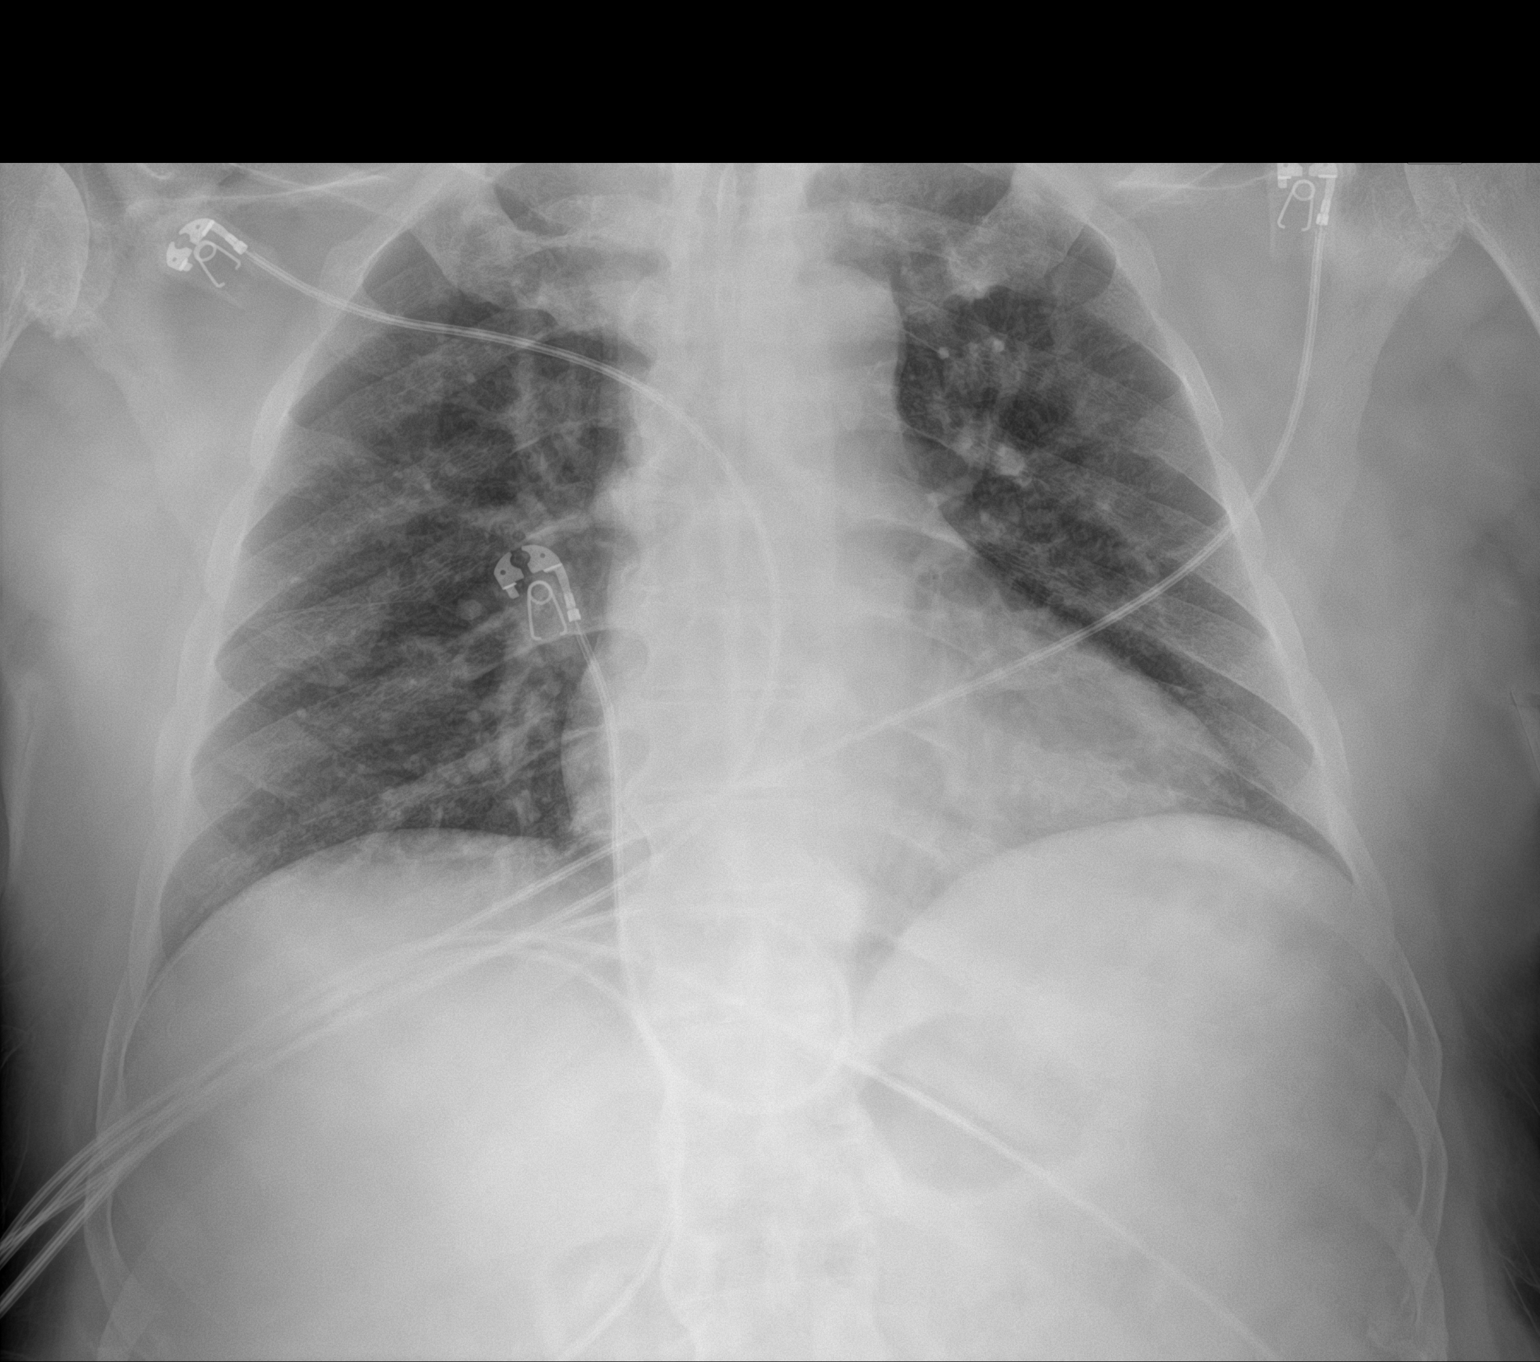

[1 of 1 positions shown; findings below may reference images not displayed]

FINDINGS: Mild cardiomegaly with mild vascular congestion. No focal
consolidation, pleural effusion or pneumothorax. Degenerative
changes of the spine. No acute osseous pathology.
IMPRESSION: Mild cardiomegaly with mild vascular congestion. No focal
consolidation.

## 2023-03-15 IMAGING — CT CT HEAD W/O CM
4 series · 16 of 47 positions shown, 18 images · non-contrast
Comparison: CT head 06/10/2020.  MRI brain 06/10/2020.

CLINICAL DATA: Mental status change of unknown cause. High fever
today and confusion.

EXAM:
CT HEAD WITHOUT CONTRAST
TECHNIQUE: Contiguous axial images were obtained from the base of the skull
through the vertex without intravenous contrast.

[Series 3: head wo · axial · 0.42mm/px · z∈[-120,-6]mm · 7 of 31 slices shown, 9 images]
[im 4/31  brain]
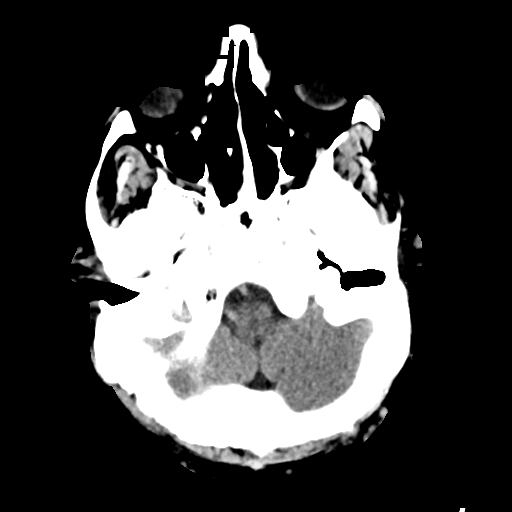
[im 4/31  bone]
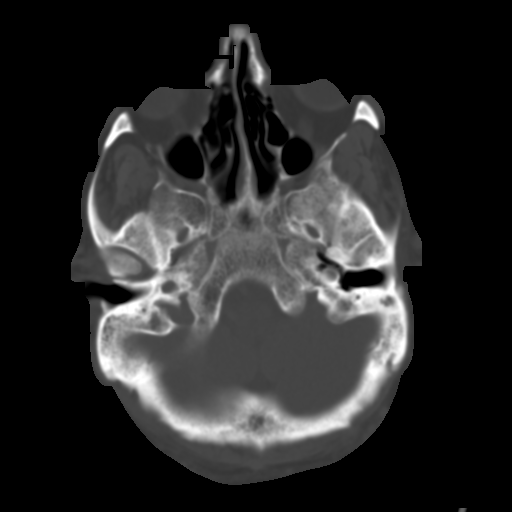
[im 8/31  brain]
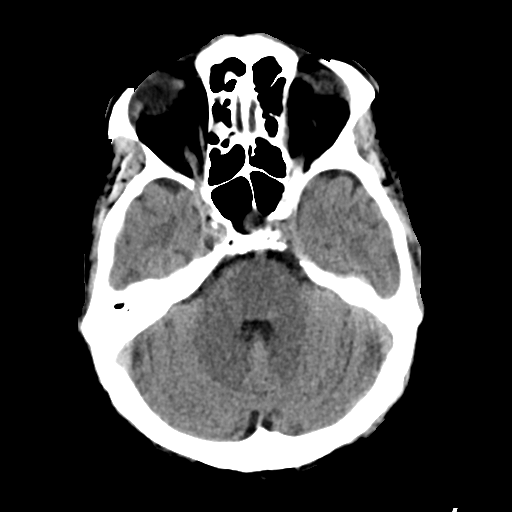
[im 12/31  brain]
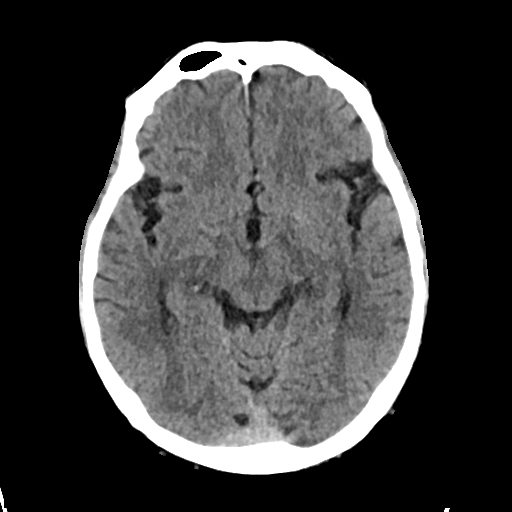
[im 16/31  brain]
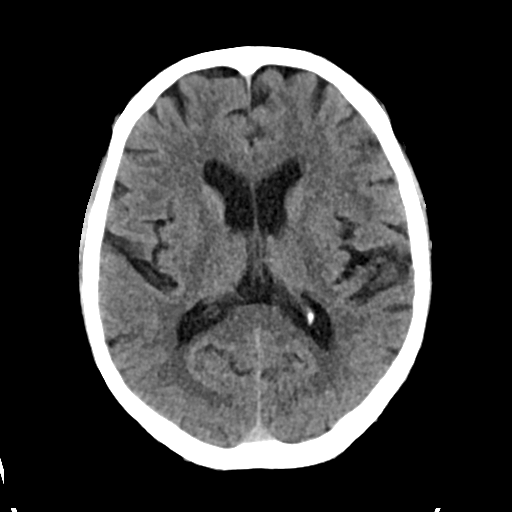
[im 19/31  brain]
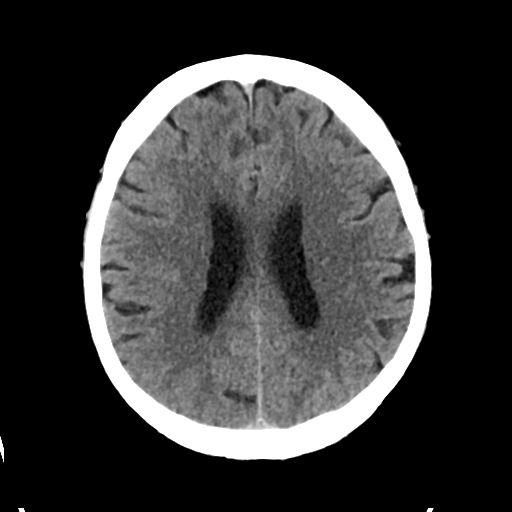
[im 19/31  bone]
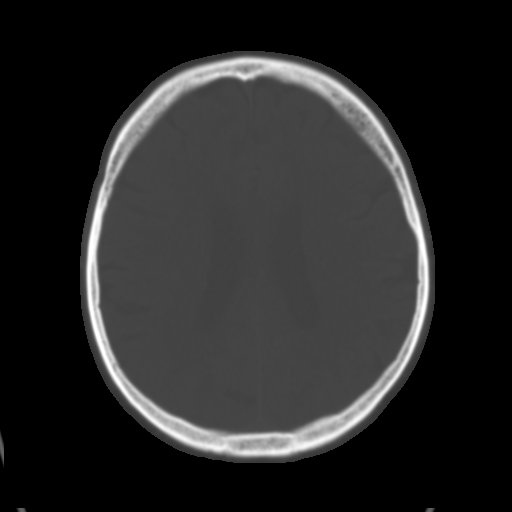
[im 23/31  brain]
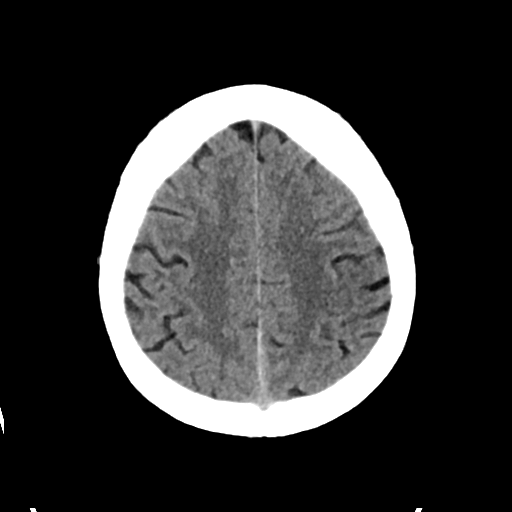
[im 27/31  brain]
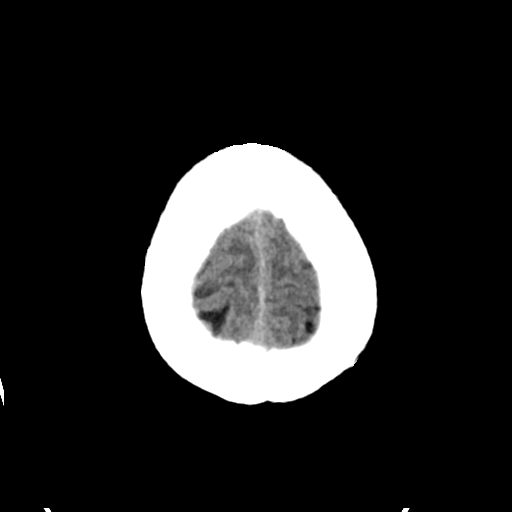

[Series 4: head bone · axial · 0.42mm/px · z∈[-122,-90]mm · 3 of 78 slices shown]
[im 8/78  bone]
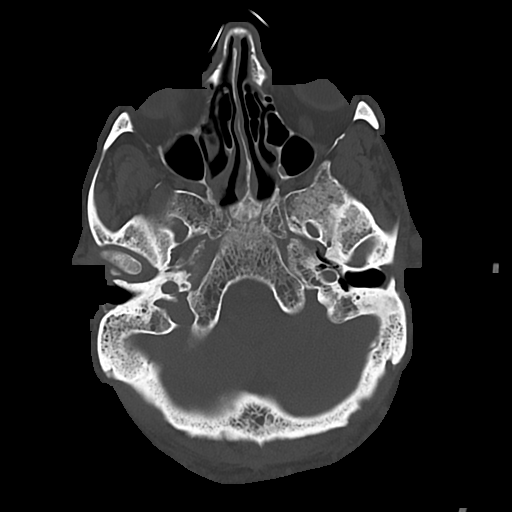
[im 16/78  bone]
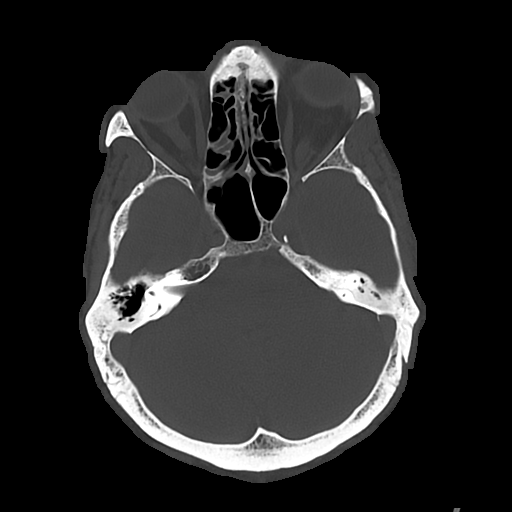
[im 24/78  bone]
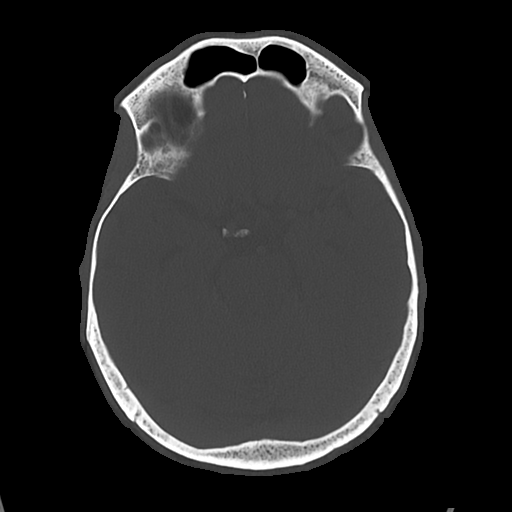

[Series 5: cor soft · coronal · 0.31mm/px · 3 of 70 slices shown]
[im 24/70  brain]
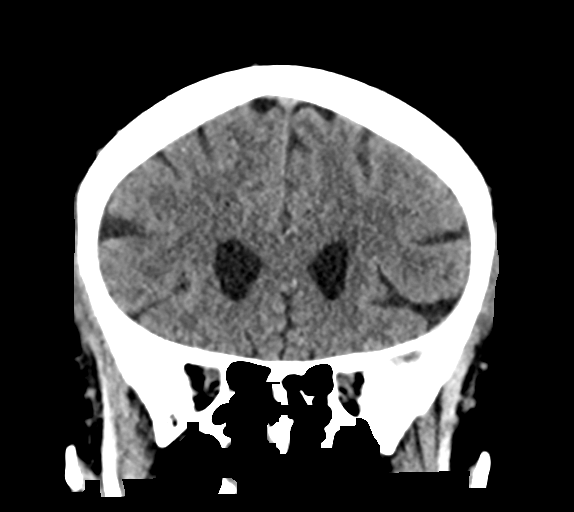
[im 31/70  brain]
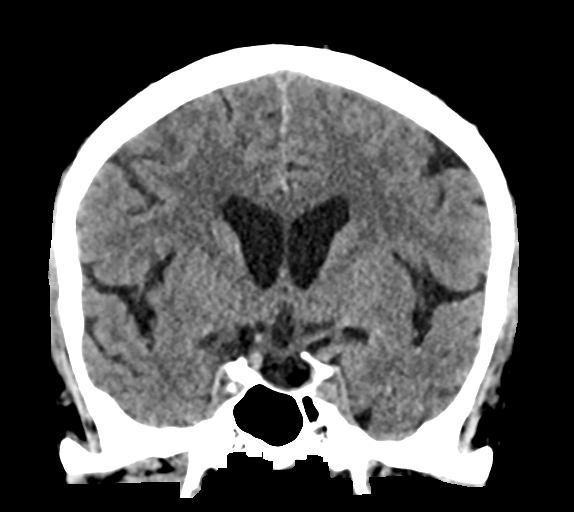
[im 39/70  brain]
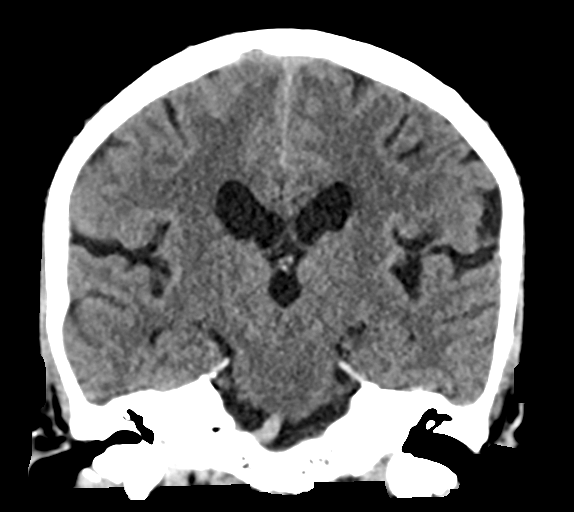

[Series 6: sag soft · sagittal · 0.32mm/px · 3 of 64 slices shown]
[im 22/64  brain]
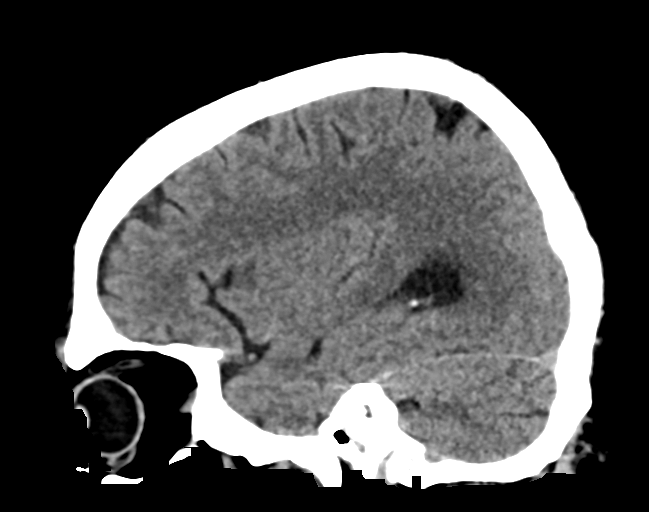
[im 32/64  brain]
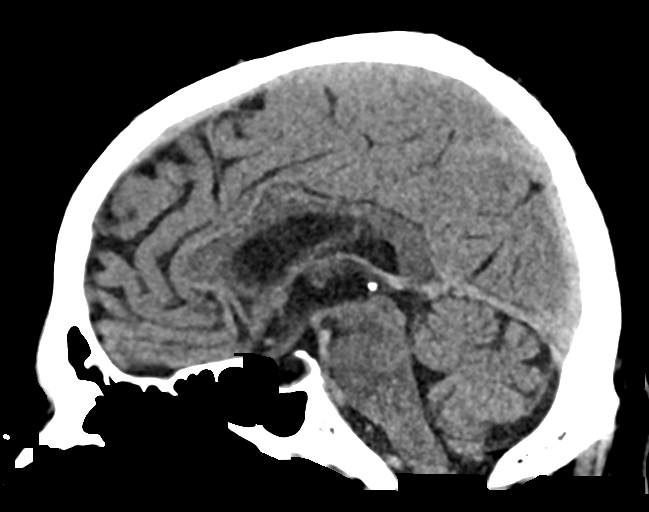
[im 43/64  brain]
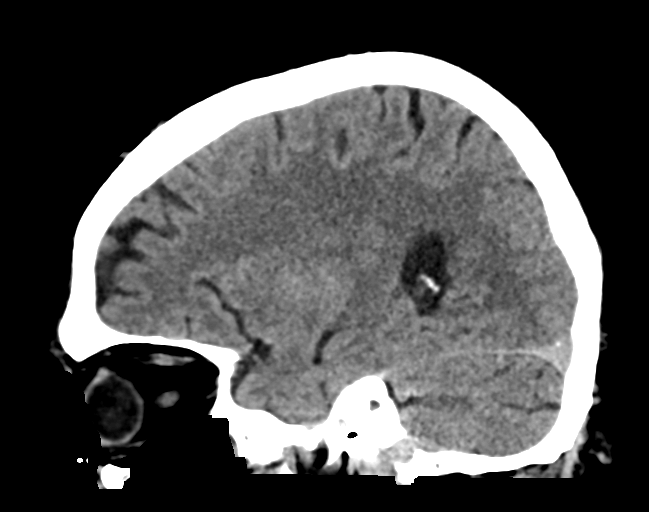

[16 of 47 positions shown; findings below may reference images not displayed]

FINDINGS: Brain: No evidence of acute infarction, hemorrhage, hydrocephalus,
extra-axial collection or mass lesion/mass effect. Mild cerebral
atrophy.

Vascular: No hyperdense vessel or unexpected calcification.

Skull: Normal. Negative for fracture or focal lesion.

Sinuses/Orbits: No acute finding.

Other: None.
IMPRESSION: No acute intracranial abnormalities.

## 2023-11-30 ENCOUNTER — Other Ambulatory Visit: Payer: Self-pay | Admitting: *Deleted

## 2023-11-30 DIAGNOSIS — N184 Chronic kidney disease, stage 4 (severe): Secondary | ICD-10-CM

## 2023-12-07 ENCOUNTER — Ambulatory Visit (HOSPITAL_COMMUNITY)
Admission: RE | Admit: 2023-12-07 | Discharge: 2023-12-07 | Disposition: A | Discharge status: Home / Self Care | Source: Ambulatory Visit | Attending: Vascular Surgery | Admitting: Vascular Surgery

## 2023-12-07 DIAGNOSIS — N184 Chronic kidney disease, stage 4 (severe): Secondary | ICD-10-CM

## 2023-12-25 NOTE — Progress Notes (Unsigned)
 [facility] [place]  ASSESSMENT / PLAN: Gabriel Fowler is a 68 y.o. right handed male in need of permanent dialysis access. Plan to proceed with left arm arteriovenous graft whenever the patient and his nephrology team are ready for us  to proceed..   CHIEF COMPLAINT: CKD 4  HISTORY OF PRESENT ILLNESS: Gabriel Fowler is a 68 y.o. male referred to clinic for evaluation of permanent dialysis access.  The patient is an Arabic speaking gentleman.  All history is obtained with the assistance of a translating service.  He is right-handed.  He has never had dialysis access before.  Has never had a port or pacemaker.  Past Medical History:  Diagnosis Date   Diabetes mellitus without complication (HCC)    Hypercholesteremia    Hypertension     Past Surgical History:  Procedure Laterality Date   EXTRA SKIN REMOVED FROM BACK OF NECK      Family History  Problem Relation Age of Onset   Hypertension Father    Stroke Neg Hx    Heart disease Neg Hx    Cancer Neg Hx     Social History   Socioeconomic History   Marital status: Married    Spouse name: Not on file   Number of children: 3   Years of education: Not on file   Highest education level: Not on file  Occupational History   Occupation: Disabled  Tobacco Use   Smoking status: Never   Smokeless tobacco: Never  Vaping Use   Vaping status: Never Used  Substance and Sexual Activity   Alcohol use: No   Drug use: No   Sexual activity: Yes  Other Topics Concern   Not on file  Social History Narrative   Married.  Lives with wife.   Social Drivers of Corporate investment banker Strain: Not on file  Food Insecurity: Not on file  Transportation Needs: Not on file  Physical Activity: Not on file  Stress: Not on file  Social Connections: Unknown (10/26/2021)   Received from [facility]   Social Network    Social Network: Not on file  Intimate Partner Violence: Unknown (09/17/2021)   Received from  [facility]   HITS    Physically Hurt: Not on file    Insult or Talk Down To: Not on file    Threaten Physical Harm: Not on file    Scream or Curse: Not on file    Allergies  Allergen Reactions   Lisinopril  Cough    Current Outpatient Medications  Medication Sig Dispense Refill   amLODipine  (NORVASC ) 10 MG tablet Take 10 mg by mouth daily.     Cholecalciferol (VITAMIN D3) 50 MCG (2000 UT) TABS Take 2,000 Units by mouth daily.     Ferrous Sulfate (IRON PO) Take 1 capsule by mouth daily.     glipiZIDE  (GLUCOTROL ) 5 MG tablet Take 5 mg by mouth 2 (two) times daily.     isosorbide  mononitrate (IMDUR ) 30 MG 24 hr tablet Take 30 mg by mouth daily.     lisinopril  (ZESTRIL ) 10 MG tablet Take 10 mg by mouth daily.     losartan (COZAAR) 50 MG tablet Take 50 mg by mouth daily.     metFORMIN  (GLUCOPHAGE ) 1000 MG tablet Take 1,000 mg by mouth 2 (two) times daily.     rosuvastatin  (CRESTOR ) 10 MG tablet Take 1 tablet (10 mg total) by mouth daily. 90 tablet 3   sodium bicarbonate 650 MG tablet Take 650 mg  by mouth 2 (two) times daily.     No current facility-administered medications for this visit.    PHYSICAL EXAM Vitals:   12/26/23 0841  BP: 111/60  Pulse: 64  Temp: 98.1 F (36.7 C)  SpO2: 97%  Weight: 162 lb (73.5 kg)  Height: 5' 9 (1.753 m)    Elderly gentleman in no distress Regular rate and rhythm Unlabored breathing Palpable brachial and radial pulses bilaterally  PERTINENT LABORATORY AND RADIOLOGIC DATA  Most recent CBC    Latest Ref Rng & Units 02/28/2022    3:17 AM 02/28/2022    1:29 AM 03/05/2021    1:40 AM  CBC  WBC 4.0 - 10.5 K/uL 11.1  8.4  9.7   Hemoglobin 13.0 - 17.0 g/dL 86.8  86.6  86.5   Hematocrit 39.0 - 52.0 % 41.0  41.3  40.7   Platelets 150 - 400 K/uL 174  187  186      Most recent CMP    Latest Ref Rng & Units 02/28/2022    1:29 AM 03/05/2021    1:40 AM 03/04/2021    2:14 AM  CMP  Glucose 70 - 99 mg/dL 848  850  856   BUN 8 - 23 mg/dL 37  90   90   Creatinine 0.61 - 1.24 mg/dL 7.31  6.31  6.31   Sodium 135 - 145 mmol/L 139  138  137   Potassium 3.5 - 5.1 mmol/L 4.3  4.1  4.5   Chloride 98 - 111 mmol/L 105  105  105   CO2 22 - 32 mmol/L 24  23  20    Calcium  8.9 - 10.3 mg/dL 9.4  8.1  8.5   Total Protein 6.5 - 8.1 g/dL 7.2  5.9  6.2   Total Bilirubin 0.3 - 1.2 mg/dL 0.7  0.5  0.4   Alkaline Phos 38 - 126 U/L 111  91  97   AST 15 - 41 U/L 14  37  21   ALT 0 - 44 U/L 19  40  20     Renal function CrCl cannot be calculated (Patient's most recent lab result is older than the maximum 21 days allowed.).  Hgb A1c MFr Bld (%)  Date Value  02/28/2022 6.7 (H)    LDL Cholesterol  Date Value Ref Range Status  02/28/2022 106 (H) 0 - 99 mg/dL Final    Comment:           Total Cholesterol/HDL:CHD Risk Coronary Heart Disease Risk Table                     Men   Women  1/2 Average Risk   3.4   3.3  Average Risk       5.0   4.4  2 X Average Risk   9.6   7.1  3 X Average Risk  23.4   11.0        Use the calculated Patient Ratio above and the CHD Risk Table to determine the patient's CHD Risk.        ATP III CLASSIFICATION (LDL):  <100     mg/dL   Optimal  899-870  mg/dL   Near or Above                    Optimal  130-159  mg/dL   Borderline  839-810  mg/dL   High  >809     mg/dL   Very High Performed  at [facility], [address], [place], [place] [address]     Superficial vein mapping shows no suitable veins for AV fistula creation   Debby SAILOR. Magda, MD [facility] Phone Number: [phone] 12/25/2023 8:36 PM   Total time spent on preparing this encounter including chart review, data review, collecting history, examining the patient, and coordinating care: 45 minutes  Portions of this report may have been transcribed using voice recognition software.  Every effort has been made to ensure accuracy; however, inadvertent computerized transcription errors may still  be present.

## 2023-12-26 ENCOUNTER — Ambulatory Visit: Attending: Vascular Surgery | Admitting: Vascular Surgery

## 2023-12-26 ENCOUNTER — Encounter: Payer: Self-pay | Admitting: Vascular Surgery

## 2023-12-26 ENCOUNTER — Telehealth: Payer: Self-pay

## 2023-12-26 VITALS — BP 111/60 | HR 64 | Temp 98.1°F | Ht 69.0 in | Wt 162.0 lb

## 2023-12-26 DIAGNOSIS — N184 Chronic kidney disease, stage 4 (severe): Secondary | ICD-10-CM | POA: Diagnosis present

## 2023-12-26 NOTE — Telephone Encounter (Signed)
 Patient states he will call to schedule L AVG with Dr. Magda.

## 2024-02-02 ENCOUNTER — Telehealth: Payer: Self-pay

## 2024-02-02 NOTE — Telephone Encounter (Signed)
 Attempted to call for surgery scheduling. LVM

## 2024-02-26 ENCOUNTER — Telehealth: Payer: Self-pay

## 2024-02-26 NOTE — Telephone Encounter (Signed)
 Called pt to schedule his surgery and spoke to pt's daughter who stated pt is wanting to speak with his nephrologist first. She is unsure when he sees nephrology next; she stated he typically f/u every 3 months with them. She will call us  when they are ready to proceed with surgery.

## 2024-02-28 ENCOUNTER — Telehealth: Payer: Self-pay

## 2024-02-28 NOTE — Telephone Encounter (Signed)
 Per patient's daughter - patient would like to discuss L arm AVG with nephrologist prior to scheduling surgery with [facility].  Surgical paperwork sent to scanning.

## 2024-04-05 NOTE — Progress Notes (Signed)
" °  Medical Record #[id number]  [facility] Karlene Frater 40y Male  Date of Assessment: 04-05-24 Location: [facility]   Patient presented for assessment without post-transplant caregiver(s) in attendance. Discussed with Interpreter/ Patient the resources an individual must possess necessary for successful transplantation with specific attention to the absolute necessity of an active network of individuals fully prepared to provide both post-transplant concrete and emotional support. Advised that psychosocial assessment could not be conducted without identified post-transplant caregiver(s) present. Instructed that Patient must reschedule assessment for a time when caregiver(s) could be in attendance. Further advised that transplant evaluation would be placed on hold until positive resolution of caregiver status. Patient was encouraged to attend additional appointments/testing schedule for this date. Provided Patient with contact name and number to request rescheduling. Through Interpreter Patient expressed understanding of discussion and intentions to reschedule.  Luke Kerns, MSW  Social Work Services  Abdominal Organ Transplant Program "

## 2024-04-26 NOTE — Telephone Encounter (Addendum)
 Called pts daughter to speak with her re: removal of pt from evaluation at this time r/t'ed to functional concerns.  Plan is for pt to have PT vs working with [employer]neakers to get stronger and then being re referred. Closing evaluation at this time and sending a letter.

## 2024-04-26 NOTE — Progress Notes (Signed)
 Placing echo/64min walk orders to be done with SW visit. Sending to scheduler and calling pt .

## 2024-04-26 NOTE — Progress Notes (Signed)
 Messaging nephrologist to determine if pt needs PT/Silver Sneakers before proceeding after 6 month with removal and re referral.

## 2024-06-12 NOTE — Telephone Encounter (Signed)
 Opened in error

## 2024-06-19 ENCOUNTER — Ambulatory Visit (HOSPITAL_COMMUNITY): Admission: EM | Admit: 2024-06-19 | Discharge: 2024-06-19 | Disposition: A | Discharge status: Home / Self Care

## 2024-06-19 ENCOUNTER — Encounter (HOSPITAL_COMMUNITY): Payer: Self-pay

## 2024-06-19 ENCOUNTER — Ambulatory Visit (HOSPITAL_COMMUNITY)

## 2024-06-19 DIAGNOSIS — M542 Cervicalgia: Secondary | ICD-10-CM | POA: Diagnosis not present

## 2024-06-19 DIAGNOSIS — M25512 Pain in left shoulder: Secondary | ICD-10-CM

## 2024-06-19 MED ORDER — LIDOCAINE 5 % EX PTCH: 1.0000 | MEDICATED_PATCH | CUTANEOUS | 0 refills | Status: AC

## 2024-06-19 NOTE — ED Triage Notes (Signed)
 Pt c/o lt neck/back/shoulder pain and decrease mobility x10 days. States unable to afford the medicines he needs. States took tylenol  with no relief.
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
## 2024-06-19 NOTE — Discharge Instructions (Addendum)
????? ???? ?????? ??????? ????? ???? ??? ?? ???. ??? ???? ?? ?????? ????? ?/?? ?????? ?? ???? ????. ° °???? ????? ???? ?? ??? ????? ?????? ?????? ?????? ?? ??????? ???????.  ????????? ?????: °???????? °  [address] ???? ??????? ?????????? ???? ???????? [id number] [phone]  ?? ??? ????? ?? ????? ??? ?????? ?????? ?????? ????? ????.  ?? ?????? ??? ???? 15-20 ????? ??? ???? ?? ?????.  ?? ??? ????? ????????? ??. ?? ???? ????? ?????? ?????? ??? 12 ????.  ????? ?? ????? ???????? ??? ?????? ?????? ?????.  ??? ???? ???? ?? ????? ?????? ????? ??? ??? ?? ???? ?? ???? ??????? ???????. ??? ???? ??????? ?????? ???? ?????? ??? ??? ???????. ???? ????? ???? ?? ??? ????? ?????? ?????? ?????? ?? ??????? ??????? ???? ?????? ??????. 'azharat surat al'ashieat alsiyniat likatifik wujud kasr 'aw khalea. kama kashafat ean taghayurat muzminat wa/'aw tanakusiat fi mufasal katafaku. yurjaa tahdid maweid mae qism jirahat aleizam li'iijra' almazid min altaqyim walealaji. almaelumat 'adnahi: '[address], [place] [id number] [phone] tama wade dhiraeik fi hamaalat katif litawfir alraahat waldaem limufasil katafaku. dae kamadat thalj limudat 15-20 daqiqatan eidat maraat fi alyawmi. tama wasf lasaqat lidukayiyn lika. dae lisiqat wahidat ywmyan wa'azalaha baed 12 saeatin. astamira fi tanawul taylinul hasab alhajat litakhfif al'almi. 'iidha zaharat ladayk 'ayu 'aerad jadidatin, farjie 'iilaa huna 'aw tabie mae tabib alrieayat al'awaliati. 'iidha kanat al'aerad shadidatan, yurjaa altawajuh 'iilaa qism altawarii. yurjaa tahdid maweid mae qism jirahat aleizam li'iijra' almazid min altaqyim waleilaj li'alam alraqabat walkatifi.   Your shoulder x-ray fracture or dislocation.  It does reveal chronic and/or degenerative changes in your shoulder joint.  Please schedule an appointment with orthopedics for further evaluation and treatment.  Information below: [facility] [address] [place],  [place]  [address] [phone]  You have been placed in a shoulder sling for comfort and support of your shoulder joint.  Apply ice on for 15-20 minutes several times a day.  You have been given a prescription for lidocaine  patches.  Apply 1 patch daily and remove after 12 hours.  Continue to take Tylenol  as needed for pain.  If you develop any new symptoms, return here or follow-up with your primary care provider.  If your symptoms are severe, please go to the emergency room.  Please schedule an appointment with orthopedics for further evaluation and treatment of your neck and shoulder pain.

## 2024-06-24 ENCOUNTER — Encounter: Payer: Self-pay | Admitting: Physician Assistant

## 2024-06-24 ENCOUNTER — Ambulatory Visit: Admitting: Physician Assistant

## 2024-06-24 DIAGNOSIS — M25512 Pain in left shoulder: Secondary | ICD-10-CM | POA: Insufficient documentation

## 2024-06-24 DIAGNOSIS — G8929 Other chronic pain: Secondary | ICD-10-CM | POA: Diagnosis not present

## 2024-06-24 NOTE — Progress Notes (Signed)
 "  Office Visit Note   Patient: Gabriel Fowler           Date of Birth: 1956/01/30           MRN: [id number] Visit Date: 06/24/2024              Requested by: Sim Emery CROME, MD [address] [place],  [place] [address] PCP: Sim Emery CROME, MD   Assessment & Plan: Visit Diagnoses:  1. Chronic left shoulder pain     Plan: Patient is a pleasant 69 year old gentleman who is seen with the help of an interpreter today.  He has had a 2-week history of left shoulder pain.  He has been taking Tylenol  as he cannot take anti-inflammatories because of his kidney disease.  He denies any injury.  He does have some stiffness in his neck but is not painful.  I think the findings were consistent with rotator cuff tendinitis.  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  We will place a referral for physical therapy.  Should follow-up with me in a month.  I did offer him an injection today but he declined  Follow-Up Instructions: Return in about 1 month (around 07/25/2024).   Orders:  No orders of the defined types were placed in this encounter.  No orders of the defined types were placed in this encounter.     Procedures: No procedures performed   Clinical Data: No additional findings.   Subjective: No chief complaint on file.   HPI patient is a pleasant 69 year old gentleman comes in today after being seen by urgent care for left shoulder pain.  He denies any injuries.  He said he just woke up with his shoulder hurting.  Denies any paresthesias or weakness in his arm.  Review of Systems  All other systems reviewed and are negative.    Objective: Vital Signs: There were no vitals taken for this visit.  Physical Exam Constitutional:      Appearance: Normal appearance.  Pulmonary:     Effort: Pulmonary effort is normal.  Neurological:     General: No focal deficit present.     Mental  Status: He is alert and oriented to person, place, and time.  Psychiatric:        Mood and Affect: Mood normal.        Behavior: Behavior normal.     Ortho Exam Examination of his left shoulder he has full passive forward elevation but resist active forward elevation to about 100 secondary to pain.  No real pain with internal rotation behind his back.  He has good abductor strength external and internal rotation.  No pain with external rotation of his shoulder.  He does have a positive empty can test mildly positive speeds test good grip strength good biceps tricep strength.  Sensation is intact Specialty Comments:  No specialty comments available.  Imaging: No results found.   PMFS History: Patient Active Problem List   Diagnosis Date Noted   Pain in left shoulder 06/24/2024   Hypertensive urgency 02/28/2022   Leukocytosis 02/28/2022   TIA (transient ischemic attack) 02/28/2022   Language barrier 02/28/2022   Noncompliance 02/28/2022   COVID-19 03/02/2021   Chronic diastolic CHF (congestive heart failure) (HCC) 03/02/2021   Non-insulin  dependent type 2 diabetes mellitus (HCC)    CKD (chronic kidney disease), stage IV (HCC)    Diabetes mellitus  without complication (HCC)    Hypertension    Hypercholesteremia    Past Medical History:  Diagnosis Date   Diabetes mellitus without complication (HCC)    Hypercholesteremia    Hypertension     Family History  Problem Relation Age of Onset   Hypertension Father    Stroke Neg Hx    Heart disease Neg Hx    Cancer Neg Hx     Past Surgical History:  Procedure Laterality Date   EXTRA SKIN REMOVED FROM BACK OF NECK     Social History   Occupational History   Occupation: Disabled  Tobacco Use   Smoking status: Never   Smokeless tobacco: Never  Vaping Use   Vaping status: Never Used  Substance and Sexual Activity   Alcohol use: No   Drug use: No   Sexual activity: Yes        "

## 2024-06-24 NOTE — Addendum Note (Signed)
 Addended by: CLEMETINE RONAL DRAGON on: 06/24/2024 09:02 AM   Modules accepted: Orders

## 2024-07-26 ENCOUNTER — Ambulatory Visit: Admitting: Physician Assistant
